# Patient Record
Sex: Female | Born: 1980 | Race: Black or African American | Hispanic: No | Marital: Married | Smoking: Current every day smoker
Health system: Southern US, Community
[De-identification: ages and names within clinical notes are randomized; demographics above are authoritative.]

## PROBLEM LIST (undated history)

## (undated) ENCOUNTER — Inpatient Hospital Stay (HOSPITAL_COMMUNITY): Payer: Self-pay

## (undated) ENCOUNTER — Inpatient Hospital Stay (HOSPITAL_COMMUNITY): Payer: BC Managed Care – PPO

## (undated) DIAGNOSIS — O09519 Supervision of elderly primigravida, unspecified trimester: Secondary | ICD-10-CM

## (undated) DIAGNOSIS — Z8759 Personal history of other complications of pregnancy, childbirth and the puerperium: Secondary | ICD-10-CM

## (undated) DIAGNOSIS — D219 Benign neoplasm of connective and other soft tissue, unspecified: Secondary | ICD-10-CM

## (undated) DIAGNOSIS — I10 Essential (primary) hypertension: Secondary | ICD-10-CM

## (undated) DIAGNOSIS — J309 Allergic rhinitis, unspecified: Secondary | ICD-10-CM

## (undated) DIAGNOSIS — Z973 Presence of spectacles and contact lenses: Secondary | ICD-10-CM

## (undated) DIAGNOSIS — N736 Female pelvic peritoneal adhesions (postinfective): Secondary | ICD-10-CM

## (undated) DIAGNOSIS — N7011 Chronic salpingitis: Secondary | ICD-10-CM

## (undated) DIAGNOSIS — J45909 Unspecified asthma, uncomplicated: Secondary | ICD-10-CM

## (undated) HISTORY — DX: Essential (primary) hypertension: I10

## (undated) HISTORY — PX: WISDOM TOOTH EXTRACTION: SHX21

## (undated) HISTORY — DX: Supervision of elderly primigravida, unspecified trimester: O09.519

## (undated) HISTORY — DX: Unspecified asthma, uncomplicated: J45.909

## (undated) HISTORY — DX: Benign neoplasm of connective and other soft tissue, unspecified: D21.9

---
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
## 2004-06-18 ENCOUNTER — Other Ambulatory Visit: Admission: RE | Admit: 2004-06-18 | Discharge: 2004-06-18 | Payer: Self-pay | Admitting: Internal Medicine

## 2005-09-08 ENCOUNTER — Ambulatory Visit: Payer: Self-pay | Admitting: Internal Medicine

## 2005-09-08 ENCOUNTER — Ambulatory Visit (HOSPITAL_COMMUNITY): Admission: RE | Admit: 2005-09-08 | Discharge: 2005-09-08 | Payer: Self-pay | Admitting: Internal Medicine

## 2005-09-15 ENCOUNTER — Ambulatory Visit: Payer: Self-pay | Admitting: Internal Medicine

## 2005-10-02 ENCOUNTER — Ambulatory Visit: Payer: Self-pay | Admitting: Internal Medicine

## 2005-10-05 ENCOUNTER — Ambulatory Visit: Payer: Self-pay | Admitting: Internal Medicine

## 2006-06-18 ENCOUNTER — Ambulatory Visit: Payer: Self-pay | Admitting: Internal Medicine

## 2006-06-25 ENCOUNTER — Ambulatory Visit: Payer: Self-pay | Admitting: Internal Medicine

## 2006-06-25 ENCOUNTER — Other Ambulatory Visit: Admission: RE | Admit: 2006-06-25 | Discharge: 2006-06-25 | Payer: Self-pay | Admitting: Internal Medicine

## 2006-06-25 ENCOUNTER — Encounter: Payer: Self-pay | Admitting: Internal Medicine

## 2006-11-19 ENCOUNTER — Ambulatory Visit: Payer: Self-pay | Admitting: Internal Medicine

## 2006-11-26 ENCOUNTER — Ambulatory Visit: Payer: Self-pay | Admitting: Internal Medicine

## 2007-03-02 ENCOUNTER — Ambulatory Visit: Payer: Self-pay | Admitting: Internal Medicine

## 2007-04-13 ENCOUNTER — Ambulatory Visit: Payer: Self-pay | Admitting: Internal Medicine

## 2007-05-23 ENCOUNTER — Ambulatory Visit: Payer: Self-pay | Admitting: Internal Medicine

## 2007-08-05 ENCOUNTER — Ambulatory Visit: Payer: Self-pay | Admitting: Internal Medicine

## 2007-08-05 DIAGNOSIS — J019 Acute sinusitis, unspecified: Secondary | ICD-10-CM | POA: Insufficient documentation

## 2007-08-05 DIAGNOSIS — I10 Essential (primary) hypertension: Secondary | ICD-10-CM | POA: Insufficient documentation

## 2007-08-05 DIAGNOSIS — R519 Headache, unspecified: Secondary | ICD-10-CM | POA: Insufficient documentation

## 2007-08-05 DIAGNOSIS — J309 Allergic rhinitis, unspecified: Secondary | ICD-10-CM | POA: Insufficient documentation

## 2007-08-05 DIAGNOSIS — R51 Headache: Secondary | ICD-10-CM | POA: Insufficient documentation

## 2007-09-06 ENCOUNTER — Telehealth: Payer: Self-pay | Admitting: Internal Medicine

## 2008-03-05 ENCOUNTER — Ambulatory Visit: Payer: Self-pay | Admitting: Internal Medicine

## 2008-03-05 DIAGNOSIS — H9319 Tinnitus, unspecified ear: Secondary | ICD-10-CM | POA: Insufficient documentation

## 2008-03-09 LAB — CONVERTED CEMR LAB
Albumin: 4.2 g/dL (ref 3.5–5.2)
Alkaline Phosphatase: 64 units/L (ref 39–117)
BUN: 8 mg/dL (ref 6–23)
Calcium: 9.4 mg/dL (ref 8.4–10.5)
Creatinine, Ser: 0.7 mg/dL (ref 0.4–1.2)
Eosinophils Absolute: 0.1 10*3/uL (ref 0.0–0.7)
Eosinophils Relative: 1.3 % (ref 0.0–5.0)
GFR calc Af Amer: 130 mL/min
GFR calc non Af Amer: 108 mL/min
Glucose, Bld: 71 mg/dL (ref 70–99)
HCT: 36.5 % (ref 36.0–46.0)
Hemoglobin: 11.8 g/dL — ABNORMAL LOW (ref 12.0–15.0)
MCV: 77.1 fL — ABNORMAL LOW (ref 78.0–100.0)
Monocytes Absolute: 0.7 10*3/uL (ref 0.1–1.0)
Monocytes Relative: 8.9 % (ref 3.0–12.0)
Neutro Abs: 5.2 10*3/uL (ref 1.4–7.7)
Platelets: 359 10*3/uL (ref 150–400)
Potassium: 4.1 meq/L (ref 3.5–5.1)
TSH: 2.58 microintl units/mL (ref 0.35–5.50)
Total Protein: 7.1 g/dL (ref 6.0–8.3)
WBC: 8.1 10*3/uL (ref 4.5–10.5)

## 2008-05-15 ENCOUNTER — Ambulatory Visit (HOSPITAL_COMMUNITY): Admission: RE | Admit: 2008-05-15 | Discharge: 2008-05-15 | Payer: Self-pay | Admitting: Obstetrics & Gynecology

## 2009-02-06 ENCOUNTER — Ambulatory Visit: Payer: Self-pay | Admitting: Internal Medicine

## 2009-02-06 DIAGNOSIS — N926 Irregular menstruation, unspecified: Secondary | ICD-10-CM | POA: Insufficient documentation

## 2009-02-06 DIAGNOSIS — K5289 Other specified noninfective gastroenteritis and colitis: Secondary | ICD-10-CM | POA: Insufficient documentation

## 2009-02-06 LAB — CONVERTED CEMR LAB
Beta hcg, urine, semiquantitative: NEGATIVE
Bilirubin Urine: NEGATIVE
Glucose, Urine, Semiquant: NEGATIVE
Ketones, urine, test strip: NEGATIVE
Specific Gravity, Urine: 1.03
Urobilinogen, UA: 0.2
pH: 5

## 2009-02-21 ENCOUNTER — Ambulatory Visit (HOSPITAL_COMMUNITY): Admission: RE | Admit: 2009-02-21 | Discharge: 2009-02-21 | Payer: Self-pay | Admitting: Obstetrics & Gynecology

## 2009-09-03 ENCOUNTER — Telehealth: Payer: Self-pay | Admitting: Internal Medicine

## 2009-09-20 ENCOUNTER — Ambulatory Visit: Payer: Self-pay | Admitting: Internal Medicine

## 2009-09-20 DIAGNOSIS — N979 Female infertility, unspecified: Secondary | ICD-10-CM | POA: Insufficient documentation

## 2009-09-24 ENCOUNTER — Telehealth: Payer: Self-pay | Admitting: *Deleted

## 2010-04-08 ENCOUNTER — Emergency Department (HOSPITAL_COMMUNITY): Admission: EM | Admit: 2010-04-08 | Discharge: 2010-04-08 | Payer: Self-pay | Admitting: Emergency Medicine

## 2010-04-08 ENCOUNTER — Telehealth: Payer: Self-pay | Admitting: Internal Medicine

## 2010-08-11 ENCOUNTER — Ambulatory Visit: Payer: Self-pay | Admitting: Family Medicine

## 2010-08-11 DIAGNOSIS — F17200 Nicotine dependence, unspecified, uncomplicated: Secondary | ICD-10-CM | POA: Insufficient documentation

## 2010-08-11 DIAGNOSIS — J45909 Unspecified asthma, uncomplicated: Secondary | ICD-10-CM | POA: Insufficient documentation

## 2010-08-11 DIAGNOSIS — J209 Acute bronchitis, unspecified: Secondary | ICD-10-CM | POA: Insufficient documentation

## 2010-11-08 ENCOUNTER — Emergency Department (HOSPITAL_COMMUNITY)
Admission: EM | Admit: 2010-11-08 | Discharge: 2010-11-09 | Payer: Self-pay | Source: Home / Self Care | Admitting: Emergency Medicine

## 2010-11-20 ENCOUNTER — Telehealth: Payer: Self-pay | Admitting: Internal Medicine

## 2010-12-14 ENCOUNTER — Encounter: Payer: Self-pay | Admitting: Obstetrics & Gynecology

## 2010-12-23 NOTE — Letter (Signed)
Summary: Out of Work  Adult nurse at [employer]  [address]   [place], [place] [address]   Phone: [phone]  Fax: [phone]    August 11, 2010   Employee:  Mckenzey P Culliton    To Whom It May Concern:   For Medical reasons, please excuse the above named employee from work for the following dates:  Start:   08/11/2010  End:   08/12/2010, may return to work this day  If you need additional information, please feel free to contact our office.         Sincerely,    Danise Edge MD

## 2010-12-23 NOTE — Assessment & Plan Note (Signed)
Summary: chest tightness/wheezing/since weekend/njr   Vital Signs:  Patient profile:   30 year old female Menstrual status:  irregular Height:      60.5 inches (153.67 cm) Weight:      227 pounds (103.18 kg) BMI:     43.76 O2 Sat:      98 % on Room air Temp:     98.5 degrees F (36.94 degrees C) oral Pulse rate:   103 / minute BP sitting:   126 / 92  (left arm) Cuff size:   large  Vitals Entered By: Josph Macho RMA (August 11, 2010 10:37 AM)  O2 Flow:  Room air CC: Chest tightness/ wheezing since this weekend X3 days/ CF Is Patient Diabetic? No   History of Present Illness: Patient in today for evaluation of 3 days URI symptoms. She notes the symptoms started abruptly 3 days ago. She is struggling with cough, wheezing. Cough is nonproductive. The symptoms started with nasal congestion productive of yellowish sputum then developed into bilateral ear pain right worse than left. She is struggling with malaise, fatigue, chills, or chest tightness, nausea and even some episodes of vomiting with extreme coughing episodes. She is a smoker and smokes roughly half pack per day although she has cut down the last few days with her worsening symptoms. Patient denies fevers, palpitations, diarrhea, anorexia, abdominal pain.  Current Medications (verified): 1)  Methyldopa 250 Mg  Tabs (Methyldopa) .Marland Kitchen.. 1 By Mouth Two Times A Day 2)  Albuterol 90 Mcg/act  Aers (Albuterol) .... Inhale 1-2 Puffs By  Mouth Every 6 Hours As Needed 3)  Fluticasone Propionate 50 Mcg/act Susp (Fluticasone Propionate) .... 2 Sprays in Each Nostril Daily  Allergies (verified): No Known Drug Allergies  Past History:  Past medical history reviewed for relevance to current acute and chronic problems. Social history (including risk factors) reviewed for relevance to current acute and chronic problems.  Past Medical History: Reviewed history from 03/05/2008 and no changes required. Hypertension Headache Allergic  rhinitis?  see old record  Social History: Reviewed history from 09/20/2009 and no changes required. 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 No apparent alopecia or balding. Eyes:  No corneal or conjunctival inflammation noted. EOMI.  Ears:  External ear exam shows no significant lesions or deformities.  Otoscopic examination reveals clear canals, tympanic membranes are intact bilaterally without bulging, retraction, inflammation or discharge. Hearing is grossly normal bilaterally. Nose:  mucosa is boggy and erythematous Mouth:  Oral mucosa and oropharynx without lesions or exudates.  Teeth in good repair. Neck:  No deformities, masses, or tenderness noted. Lungs:  Normal respiratory effort, chest expands symmetrically. Lungs are clear to auscultation, no crackles or wheezes. Heart:  Normal rate and regular rhythm. S1 and S2 normal without gallop, murmur, click, rub or other extra sounds. Decreased breath sounds b/l bases Abdomen:  Bowel sounds positive,abdomen soft and non-tender without masses, organomegaly or hernias noted. Extremities:  No clubbing, cyanosis, edema, or deformity noted   Cervical Nodes:  No lymphadenopathy noted Psych:  Cognition and judgment appear intact. Alert and cooperative with normal attention span and concentration. No apparent delusions, illusions, hallucinations   Impression & Recommendations:  Problem # 1:  ACUTE BRONCHITIS (ICD-466.0)  The following medications were removed from the medication list:  Albuterol 90 Mcg/act Aers (Albuterol) ..... Inhale 1-2 puffs by  mouth every 6 hours as needed Her updated medication list for this problem includes:    Proair Hfa 108 (90 Base) Mcg/act Aers  (Albuterol sulfate) .Marland Kitchen... 1-2 puffs by mouth q 4 hour as needed cough/sob/wheeze    Zithromax 250 Mg Tabs (Azithromycin) .Marland Kitchen... 2 tabs by mouth once then once daily x 4days Cetirizine daily and Fluticasone is sent in  Orders: Tobacco use cessation intermediate 3-10 minutes ([address]) Taken out of work for today  Problem # 2:  ALLERGIC RHINITIS (ICD-477.9)  Her updated medication list for this problem includes:    Fluticasone Propionate 50 Mcg/act Susp (Fluticasone propionate) .Marland Kitchen... 2 sprays in each nostril daily    Cetirizine Hcl 10 Mg Tabs (Cetirizine hcl) .Marland Kitchen... 1 tab by mouth daily as needed allergies/congestion  Problem # 3:  TOBACCO ABUSE (ICD-305.1)  Orders: Tobacco use cessation intermediate 3-10 minutes ([address]) Encouraged to avoid each cigarette for at least 10 minutes whenever you have a craving  Complete Medication List: 1)  Methyldopa 250 Mg Tabs (Methyldopa) .Marland Kitchen.. 1 by mouth two times a day 2)  Fluticasone Propionate 50 Mcg/act Susp (Fluticasone propionate) .... 2 sprays in each nostril daily 3)  Proair Hfa 108 (90 Base) Mcg/act Aers (Albuterol sulfate) .Marland Kitchen.. 1-2 puffs by mouth q 4 hour as needed cough/sob/wheeze 4)  Zithromax 250 Mg Tabs (Azithromycin) .... 2 tabs by mouth once then once daily x 4days 5)  Cetirizine Hcl 10 Mg Tabs (Cetirizine hcl) .Marland Kitchen.. 1 tab by mouth daily as needed allergies/congestion  Patient Instructions: 1)  Take your antibiotic as prescribed until ALL of it is gone, but stop if you develop a rash or swelling and contact our office as soon as possible.  2)  Acute Bronchitis symptoms for less then 10 days are not  helped by antibiotics. Take over the counter cough medications. Call if no improvement in 5-7 days, sooner if increasing cough, fever, or new symptoms ( shortness of breath, chest pain) .  3)  Recommend staying out of work for today 4)  Push fluid, increase rest, use Albuterol as needed. 5)  Take a probiotic such as Activia yogurt or Align capsules  daily with antibiotic use.  6)  Avoid all products with Sudafed (generic name is pseudoephedrine) due to elevated BP today Prescriptions: CETIRIZINE HCL 10 MG TABS (CETIRIZINE HCL) 1 tab by mouth daily as needed allergies/congestion  #30 x 1   Entered and Authorized by:   Danise Edge MD   Signed by:   Danise Edge MD on 08/11/2010   Method used:   Electronically to        [facility]  [address] # [id number]* (retail)       [address]       [place], [place]  [address]       Ph: [phone] or [phone]       Fax: [phone]   RxID:   [id number] FLUTICASONE PROPIONATE 50 MCG/ACT SUSP (FLUTICASONE PROPIONATE) 2 sprays in each nostril daily  #1 x 2   Entered and Authorized by:   Danise Edge MD   Signed by:   Danise Edge MD on 08/11/2010   Method used:   Electronically to        [facility]  [address] [id number]* (retail)       [address]       [facility]  [place], [place]  [address]       Ph: [phone] or [phone]       Fax: [phone]   RxID:   [id number] ZITHROMAX 250 MG TABS (AZITHROMYCIN) 2 tabs by mouth once then once daily x 4days  #6 x 0   Entered and Authorized by:   Danise Edge MD   Signed by:   Danise Edge MD on 08/11/2010   Method used:   Electronically to        [facility]  [address] [id number]* (retail)       [address]       [place], [place]  [address]       Ph: [phone] or [phone]       Fax: [phone]   RxID:   [id number] PROAIR HFA 108 (90 BASE) MCG/ACT AERS (ALBUTEROL SULFATE) 1-2 puffs by mouth q 4 hour as needed cough/sob/wheeze  #1 x 2   Entered and Authorized by:   Danise Edge MD   Signed by:   Danise Edge MD on 08/11/2010   Method used:   Electronically to        [facility]  [address] [id number]* (retail)       [address]       [place], [place]  [address]       Ph: [phone] or [phone]       Fax: [phone]   RxID:   [id number]

## 2010-12-23 NOTE — Progress Notes (Signed)
Summary: boil  Phone Note Call from Patient Call back at Home Phone [phone]   Caller: Patient Summary of Call: Pt has a boil that needs to be lanced. Pt has tried warm compress. It is red and swollen. It is located on her rt tigh. No fever. It has been going on x 3 days. Initial call taken by: Romualdo Bolk, CMA Duncan Dull),  Apr 08, 2010 5:13 PM  Follow-up for Phone Call        Per Dr. Fabian Sharp have pt go to urgent care at [facility] or [facility]. Pt aware. Follow-up by: Romualdo Bolk, CMA (AAMA),  Apr 08, 2010 5:15 PM

## 2010-12-25 NOTE — Progress Notes (Signed)
Summary: ED Visit on 11/08/10   Shirley Alvarez, Shirley Alvarez MRN: [id number] Acct#: [id number] PHYSICIAN DOCUMENTATION SHEET Thu Dec 22 09:41:19 EST 2011 Shirley Alvarez. [facility] [address] [place], [place] [address] PHONE: [phone] MRN: [id number] Account #: [id number] Name: Shirley Alvarez Sex: F Age: 30 DOB: 08-02-1981 Complaint: Vaginal bleeding Primary Diagnosis: Pregnancy Arrival Time: 11/08/2010 22:16 Discharge Time: 11/09/2010 00:33 All Providers: Ms. Drucie Opitz - PA; Dr. Cheri Guppy, MD PROVIDER: Ms. Drucie Opitz - PA HPI: The patient is a 30 year old female who presents with a chief complaint of vaginal bleeding. The history was provided by the patient. patient who is G0 presents to ER with complaints of vaginal spotting that started today stating she finished a normal menstruation 10 days ago but began spotting today and had 5-10 minutes of sharp pain in left lower abdomen though states no pain since then. states she vomited earlier today but no vomiting since. denies fevers, chills, CP, SOB, abdominal pain, n/d, dysuria, hematuria. patient states she she had pain in left upper thigh at the same time as abdominal pain but no pain since, denies skin changes. denies lower extremity pain or swelling. 22:57 11/08/2010 by Drucie Opitz - PA, Ms. ROS: Statement: all systems negative except as marked or noted in the HPI 23:00 11/08/2010 by Drucie Opitz - PA, Ms. PMH: Documentation: physician assistant reviewed/amended Historian: patient Patient's Current Physicians Patient's Current Physicians (please list PCP first) Tamela Oddi - OB/GYN, Nita Sells - IM, Neta Mends Last normal period: 10/29/2010 Past medical history: hypertension Social History: no drug abuse, current smoker w/i last 12 mos., occasional drinker Special Needs: none Allergies Drug Reaction Allergy Note NKDA 22:58 11/08/2010 by Drucie Opitz - PA, Ms. 1 Shirley Alvarez - MRN:  [id number] Acct#: [id number] Home Medications: Documentation: physician assistant reviewed/amended Medications Medication [Medication] Dosage Frequency Last Dose metoprolol tartrate Oral albuterol Inhl Flonase Nasl 22:59 11/08/2010 by Drucie Opitz - PA, Ms. Physical examination: Vital signs and O2 SAT: reviewed Constitutional: well developed, well hydrated, in no acute distress, obese Head and Face: normocephalic, atraumatic Eyes: normal appearance, no scleral icterus Neck: supple, full range of motion Spine: entire spine non-tender Cardiovascular: regular rate and rhythm, no murmur, rub, or gallop Respiratory: normal, breath sounds clear & equal bilaterally, no rales, rhonchi, wheezes, or rub Chest: nontender, no deformity, movement normal, no crepitus Abdomen: soft, nontender, nondistended, no masses, no hepatosplenomegaly, normal bowel sounds, no guarding, no rebound tenderness Genitourinary Exam: Vulva: bilateral normal Vagina and cervix: Positive for blood in vagina, bleeding moderate amount, cervical os closed and cervical os nulliparous. Negative for clots in vagina, products of conception in vagina and cervical motion tenderness. Adnexa: no palpable mass, no tenderness bilaterally Uterus: examination limited due to body habitus Extremities: normal, no deformity, full range of motion, neurovascularly intact, pulses normal, no tenderness, no edema Neuro: AA&Ox3, Cranial Nerves II-XII intact, motor intact in all extremities, sensation normal , normal reflexes, gait normal, normal speech Skin: color normal, no rash Psychiatric: AA&Ox4 23:01 11/08/2010 by Drucie Opitz - PA, Ms. Reviewed result: Result Type: Cleda Daub: [id number] Step Type: LAB Procedure Name: PREGNANCY, URINE POC Procedure: PREGNANCY, URINE POC Procedure Notes: PREGNANCY, URINE - THE SENSITIVITY OF THIS METHODOLOGY IS >24 mIU/mL Result: PREGNANCY, URINE POSITIVE 2 Shirley Alvarez -  MRN: [id number] Acct#: [id number] 23:08 11/08/2010 by Drucie Opitz - PA, Ms. Reviewed result: Result Type: Cleda Daub: [id number] Step Type: LAB Procedure Name: WET PREP Procedure: WET PREP Result: YEAST NONE SEEN [NS] TRICHOMONAS NONE  SEEN [NS] CLUE CELLS FEW [NS] A WBC WP NONE SEEN [NS] 23:10 11/08/2010 by Drucie Opitz - PA, Ms. Reviewed result: Result Type: Cleda Daub: [id number] Step Type: LAB Procedure Name: I STAT CHM 8 PANEL Procedure: I STAT CHM 8 PANEL Result: SODIUM 138 mEq/L [135-145] POTASSIUM 3.8 mEq/L [3.5-5.1] CHLORIDE 103 mEq/L [96-112] BUN 4 mg/dL [5-40] L CREATININE 0.9 mg/dL [9.8-1.1] GLUCOSE 90 mg/dL [91-47] CALCIUM, IONIZED 1.14 mmol/L [1.12-1.32] TCO2 25 mmol/L [0-100] HEMOGLOBIN 12.9 g/dL [82.9-56.2] HEMATOCRIT 38.0 % [36.0-46.0] 23:10 11/08/2010 by Drucie Opitz - PA, Ms. Reviewed result: Result Type: Cleda Daub: [id number] Step Type: LAB Procedure Name: HCG, QUANTITATIVE Procedure: HCG, QUANTITATIVE Procedure Notes: HCG, QUANTITATIVE - GEST. AGE CONC. (mIU/mL) <=1 WEEK 5 - 50 2 WEEKS 50 - 500 3 WEEKS 100 - 10,000 4 WEEKS 1,000 - 30,000 5 WEEKS 3,500 - 115,000 6-8 WEEKS 12,000 - 270,000 12 WEEKS 15,000 - 3 Shirley Alvarez - MRN: [id number] Acct#: [id number] 220,000 FEMALE AND NON-PREGNANT FEMALE: LESS THAN 5 mIU/mL Result: HCG, QUANTITATIVE 1911 mIU/mL [<5] H 23:49 11/08/2010 by Drucie Opitz - PA, Ms. Reviewed result: Result Type: Cleda Daub: [id number] Step Type: XRAY Procedure Name: US OB TRANSVAGINAL MODIFY Procedure: US OB TRANSVAGINAL MODIFY Result: Clinical Data: LMP 10/28/2010. Vaginal bleeding and cramping. Urine pregnancy test is positive. Beta HCG level is unknown. OBSTETRIC <14 WK [place] AND TRANSVAGINAL OB US Technique: Both transabdominal and transvaginal ultrasound examinations were performed for complete evaluation of the gestation as well as the maternal uterus, adnexal regions, and pelvic  cul-de-sac. Transvaginal technique was performed to assess early pregnancy. Comparison: None. Intrauterine gestational sac: None visualized Yolk sac: None Embryo: None Maternal uterus/adnexae: Uterus is normal in echogenicity and contour. The endometrium is approximately 8 mm in thickness. Both ovaries have normal appearances. Color Doppler flow is identified in both ovaries. No adnexal mass is seen. There is a small amount of simple-appearing free fluid in the cul-de-sac. IMPRESSION: No intrauterine pregnancy is identified. Differential diagnosis includes intrauterine pregnancy too early to visualize, sonographically occult ectopic pregnancy, or spontaneous abortion. Suggest correlation with Beta HCG levels and follow-up ultrasound as indicated. 23:56 11/08/2010 by Drucie Opitz - PA, Ms. [address] [place], [place] - MRN: [id number] Acct#: [id number] Reviewed result: Result Type: Cleda Daub: [id number] Step Type: XRAY Procedure Name: US OB COMP LESS 14 WK Procedure: US OB COMP LESS 14 WK Result: Clinical Data: LMP 10/28/2010. Vaginal bleeding and cramping. Urine pregnancy test is positive. Beta HCG level is unknown. OBSTETRIC <14 WK [place] AND TRANSVAGINAL OB US Technique: Both transabdominal and transvaginal ultrasound examinations were performed for complete evaluation of the gestation as well as the maternal uterus, adnexal regions, and pelvic cul-de-sac. Transvaginal technique was performed to assess early pregnancy. Comparison: None. Intrauterine gestational sac: None visualized Yolk sac: None Embryo: None Maternal uterus/adnexae: Uterus is normal in echogenicity and contour. The endometrium is approximately 8 mm in thickness. Both ovaries have normal appearances. Color Doppler flow is identified in both ovaries. No adnexal mass is seen. There is a small amount of simple-appearing free fluid in the cul-de-sac. IMPRESSION: No intrauterine pregnancy is identified.  Differential diagnosis includes intrauterine pregnancy too early to visualize, sonographically occult ectopic pregnancy, or spontaneous abortion. Suggest correlation with Beta HCG levels and follow-up ultrasound as indicated. 23:56 11/08/2010 by Drucie Opitz - PA, Ms. MDM: Comments: [place] for early IUP, occult ectopic or misscarriage with patinet who has no abdominal pain, afebrile and normal VS with established ObGyn who is agreeable to close follow up 23:58 11/08/2010 by  Drucie Opitz - PA, Ms. Patient disposition: 5 Sharayah, [place] - MRN: [id number] Acct#: [id number] Patient disposition: Disch - Home Primary Diagnosis: pregnancy Additional diagnoses: abnormal vaginal bleeding Counseling: advised of diagnosis, advised of treatment plan, advised of xray and lab findings, advised of need for close follow-up, advised of need to return for worsening or changing symptoms, advised of specific symptoms that should prompt their return, patient voices understanding 23:59 11/08/2010 by Drucie Opitz - PA, Ms. Medication disposition: Medications Medication [Medication] Dosage Frequency Last Dose Medication disposition PCP contact metoprolol tartrate Oral continue albuterol Inhl continue Flonase Nasl continue 23:59 11/08/2010 by Drucie Opitz - PA, Ms. Documentation completed by Responsible Physician 00:01 11/09/2010 by Drucie Opitz - PA, Ms. Discharge: Discharge Instructions: abnormal vaginal bleeding, folic acid for pregnancy, *free text Append a Note to Discharge Instructions: Use prenatal vitamins and follow up with Dr. Jean RosenthalChristell Constant for re-evaluation in the next 2-3 days for pre-natal care and repeat blood work and ultra-sounds. Go to [facility] for increase in vaginal bleeding or abdominal pain. May use tylenol for mild aches and pain. Referral/Appointment Refer Patient To: Phone Number: Follow-up in Appointment Details: Lajuana Ripple [phone] [facility] - OB/GYN, [place] Floor [phone] 00:01 11/09/2010 by Drucie Opitz - PA, Ms. PROVIDER: Dr. Cheri Guppy, MD Chart electronically signed by ER Physician 00:09 11/09/2010 by Cheri Guppy, MD, Dr. 6 Cat[place], [place] - MRN: [id number] Acct#: [id number] Attending: Supervision of: Midlevel: evaluation and management procedures were performed by the PA/NP/CNM under my supervision/collaboration. 00:08 11/09/2010 by Cheri Guppy, MD, Dr. Libby Maw orders: Verify orders: verify all orders 00:08 11/09/2010 by Cheri Guppy, MD, Dr. REVIEWER: Joyce Gross - Reviewer Review completed: Documentation completed 09:41 11/13/2010 by Joyce Gross - Reviewer 7

## 2011-02-02 LAB — WET PREP, GENITAL
Trich, Wet Prep: NONE SEEN
Yeast Wet Prep HPF POC: NONE SEEN

## 2011-02-02 LAB — POCT I-STAT, CHEM 8
Chloride: 103 mEq/L (ref 96–112)
Creatinine, Ser: 0.9 mg/dL (ref 0.4–1.2)
HCT: 38 % (ref 36.0–46.0)
Hemoglobin: 12.9 g/dL (ref 12.0–15.0)
Potassium: 3.8 mEq/L (ref 3.5–5.1)
Sodium: 138 mEq/L (ref 135–145)

## 2011-02-02 LAB — HCG, QUANTITATIVE, PREGNANCY: hCG, Beta Chain, Quant, S: 1911 m[IU]/mL — ABNORMAL HIGH (ref <5)

## 2011-02-06 ENCOUNTER — Ambulatory Visit (INDEPENDENT_AMBULATORY_CARE_PROVIDER_SITE_OTHER): Payer: BC Managed Care – PPO | Admitting: Internal Medicine

## 2011-02-06 ENCOUNTER — Other Ambulatory Visit: Payer: Self-pay | Admitting: Internal Medicine

## 2011-02-06 ENCOUNTER — Encounter: Payer: Self-pay | Admitting: Internal Medicine

## 2011-02-06 VITALS — BP 120/80 | HR 84 | Temp 98.8°F | Wt 241.0 lb

## 2011-02-06 DIAGNOSIS — J329 Chronic sinusitis, unspecified: Secondary | ICD-10-CM

## 2011-02-06 DIAGNOSIS — F17200 Nicotine dependence, unspecified, uncomplicated: Secondary | ICD-10-CM

## 2011-02-06 DIAGNOSIS — M79609 Pain in unspecified limb: Secondary | ICD-10-CM

## 2011-02-06 DIAGNOSIS — M79605 Pain in left leg: Secondary | ICD-10-CM

## 2011-02-06 DIAGNOSIS — R51 Headache: Secondary | ICD-10-CM

## 2011-02-06 DIAGNOSIS — J309 Allergic rhinitis, unspecified: Secondary | ICD-10-CM

## 2011-02-06 DIAGNOSIS — I10 Essential (primary) hypertension: Secondary | ICD-10-CM

## 2011-02-06 MED ORDER — AZITHROMYCIN 250 MG PO TABS: 250.0000 mg | ORAL_TABLET | ORAL | Status: AC

## 2011-02-06 NOTE — Patient Instructions (Signed)
stay on your allergy nose spray Add the antibiotic. Call in a 5-7 days if not getting better or if worse. There may be a component of allergic sinusitis and sometimes I add prednisone to the antibiotic  Someone will contact you about a Headache referral Leg pain may be sciatica and  Avoid prolonged sitting .   Lumbar Radiculopathy, Sciatica Sciatica is a weakness and/or changes in sensation (tingling, jolts, hot and cold, numbness) along the path the sciatic nerve travels. Irritation or damage to lumbar nerve roots is often also referred to as lumbar radiculopathy.  Lumbar radiculopathy (Sciatica) is the most common form of this problem. Radiculopathy can occur in any of the nerves coming out of the spinal cord. The problems caused depend on which nerves are involved. The sciatic nerve is the large nerve supplying the branches of nerves going from the hip to the toes. It often causes a numbness or weakness in the skin and/or muscles that the sciatic nerve serves. It also may cause symptoms (problems) of pain, burning, tingling, or electric shock-like feelings in the path of this nerve. This usually comes from injury to the fibers that make up the sciatic nerve. Some of these symptoms are low back pain and/or unpleasant feelings in the following areas:  From the mid-buttock down the back of the leg to the back of the knee.   And/or the outside of the calf and top of the foot.   And/or behind the inner ankle to the sole of the foot.  CAUSES  Herniated or slipped disc. Discs are the little cushions between the bones in the back.   Pressure by the piriformis muscle in the buttock on the sciatic nerve (Piriformis Syndrome).   Misalignment of the bones in the lower back and buttocks (Sacroiliac Joint Derangement).   Narrowing of the spinal canal that puts pressure on or pinches the fibers that make up the sciatic nerve.   A slipped vertebra that is out of line with those above or beneath it.    Abnormality of the nervous system itself so that nerve fibers do not transmit signals properly, especially to feet and calves (neuropathy).   Tumor (this is rare).  Your caregiver can usually determine the cause of your sciatica and begin the treatment most likely to help you. TREATMENT Taking over-the-counter painkillers, physical therapy, rest, exercise, spinal manipulation, and injections of anesthetics and/or steroids may be used. Surgery, acupuncture, and Yoga can also be effective. Mind over matter techniques, mental imagery, and changing factors such as your bed, chair, desk height, posture, and activities are other treatments that may be helpful. You and your caregiver can help determine what is best for you. With proper diagnosis, the cause of most sciatica can be identified and removed. Communication and cooperation between your caregiver and you is essential. If you are not successful immediately, do not be discouraged. With time, a proper treatment can be found that will make you comfortable. HOME CARE INSTRUCTIONS  If the pain is coming from a problem in the back, applying ice to that area 4  times per day while awake, may be helpful. Put the ice in a plastic bag. Place a towel between the bag of ice and your skin.   You may exercise or perform your usual activities if these do not aggravate your pain, or as suggested by your caregiver.   Only take over-the-counter or prescription medicines for pain, discomfort, or fever as directed by your caregiver.   If your  caregiver has given you a follow-up appointment, it is very important to keep that appointment. Not keeping the appointment could result in a chronic or permanent injury, pain, and disability. If there is any problem keeping the appointment, you must call back to this facility for assistance.  SEEK IMMEDIATE MEDICAL CARE IF:  You experience loss of control of bowel or bladder.   You have increasing weakness in the trunk,  buttocks, or legs.   There is numbness in any areas from the hip down to the toes.   You have difficulty walking or keeping your balance.   You have any of the above, with fever or forceful vomiting.  Document Released: 11/03/2001 Document Re-Released: 12/01/2009 [facility] Patient Information 2011 [place], [name].  Marland Kitchen

## 2011-02-06 NOTE — Telephone Encounter (Signed)
Opened in error

## 2011-02-07 ENCOUNTER — Encounter: Payer: Self-pay | Admitting: Internal Medicine

## 2011-02-07 NOTE — Assessment & Plan Note (Signed)
Currently is only taking 2 to 3 cigarettes every few days  But other smoke in the household intricate environmental tobacco smoke  Counseling today.

## 2011-02-07 NOTE — Progress Notes (Signed)
  Subjective:    Patient ID: Shirley Alvarez, female    DOB: [birth year], 30 y.o.   MRN: [id number]  HPI Patient comes in today for acute care visit. Just a couple of issues. 1: over the last 3 to 4 weeks she has had upper respiratory congestion like a head cold that has she feels turned into a sinus infection. She denies any fever currently but has facial aching pressure and upper teeth discomfort she's congested and has some occasional discharge but not as bad as in the past. She has a sore throat and Right ear discomfort with this.  She has a history of allergies with some recent flare but it's been using her Flonase regularly.  2. Her headaches are ongoing which are different than her sinus problems she gets headaches now to do three times a week we had changed her medications and monitored and she still having problems she agrees to go to a headache specialist.  3. Left leg has had some problems off and on recently first the right leg in the left leg she describes it as a pain traveling behind her left side down to her foot. No current weakness but originally her right leg felt like it was going to get out. She denies any back pain lifting or injury or swelling.   Review of Systems Negative chest pain shortness of breath current wheezing bleeding rest as per HPI    Objective:   Physical Exam That is a well-developed well-nourished African-American female  In nad who looks allergic and congested. HEENT: Normocephalic ;atraumatic , Eyes;  PERRL, EOMs  Full, lids and conjunctiva clear,,Ears: no deformities, canals nl, TM landmarks normal, Nose: no deformity or discharge  Turbinates and creased left para nasal area is tender Mouth : OP clear without lesion or edema . Neck: Supple without adenopathy or masses Chest:  Clear to A&P without wheezes rales or rhonchi CV:  S1-S2 no gallops or murmurs peripheral perfusion is normal Neruo : grossly intact  LEft leg nl no tenderness  Neg slr   gait  is nl  no bruising     Assessment & Plan:  Possible sinusitis unclear if allergic or infectious discussed options she states she did not have a good experience with amoxicillin before and the azithromycin helped her after two days when she took it in the fall. Other options possible will go ahead and give her the same innocent day and continue on her Flonase. I'm not convinced that this is not allergic and we could consider adding more allergy medication.  Headaches ;   Recurrent ongoing agree with referral nonfocal exam today  Leg pain currently is improving possible sciatica discussion in counseling Tobacco : disc strategies to stop altogether and avoid ETS.

## 2011-02-09 LAB — CULTURE, ROUTINE-ABSCESS

## 2011-02-10 ENCOUNTER — Telehealth: Payer: Self-pay | Admitting: *Deleted

## 2011-02-10 MED ORDER — PREDNISONE 20 MG PO TABS: ORAL_TABLET | ORAL | Status: DC

## 2011-02-10 NOTE — Telephone Encounter (Signed)
Pt started an antibiotic 4 days ago, but is wheezing now and SOB.  No fever.  Would like Prednisone. Does not feel like she needs office appt and Dr. Fabian Sharp knows she has asthma.

## 2011-02-10 NOTE — Telephone Encounter (Signed)
Addended byMadelin Headings on: 02/10/2011 05:28 PM   Modules accepted: Orders

## 2011-02-10 NOTE — Telephone Encounter (Signed)
agree

## 2011-02-18 ENCOUNTER — Telehealth: Payer: Self-pay | Admitting: *Deleted

## 2011-02-18 NOTE — Telephone Encounter (Signed)
Spoke with [facility] on call - then relayed info to dr. Fabian Sharp via phone - pt to be seen in next 48 h for reck and take meds today as rx'd (only one dose thus far) - to go to er if symptoms or problems before OV

## 2011-02-18 NOTE — Telephone Encounter (Signed)
Left message on machine to call back  

## 2011-02-18 NOTE — Telephone Encounter (Signed)
Have her get a blood pressure machine for her use . Get measurements      If still elevated  Can work her in this week Otherwise  ov next week and bring her machine  Review of  Out records show normal BP readings even 2 weeks ago.

## 2011-02-18 NOTE — Telephone Encounter (Signed)
Charmaine calling from [facility] PT's bp today 174/127 on left 166/123 on rt. Dr. Antionette Char. No complaints but has been noticing that she has been having headaches.

## 2011-02-19 ENCOUNTER — Encounter: Payer: Self-pay | Admitting: Internal Medicine

## 2011-02-19 NOTE — Telephone Encounter (Signed)
Pt aware and will take her bp readings. She is coming in tomorrow at 3:30pm

## 2011-02-20 ENCOUNTER — Ambulatory Visit (INDEPENDENT_AMBULATORY_CARE_PROVIDER_SITE_OTHER): Payer: BC Managed Care – PPO | Admitting: Internal Medicine

## 2011-02-20 ENCOUNTER — Encounter: Payer: Self-pay | Admitting: Internal Medicine

## 2011-02-20 VITALS — BP 120/80 | HR 60 | Wt 246.0 lb

## 2011-02-20 DIAGNOSIS — R51 Headache: Secondary | ICD-10-CM

## 2011-02-20 DIAGNOSIS — I10 Essential (primary) hypertension: Secondary | ICD-10-CM

## 2011-02-20 NOTE — Patient Instructions (Addendum)
Take medication twice a day  And monitor BP readings as we discussed  DASH diet   May help a good bit. Avoid processed foods andsodium. Call if BP readings are over 155/105 on a regular basis  In the meantime. Keep you appt with Headache people.

## 2011-02-20 NOTE — Progress Notes (Signed)
  Subjective:    Patient ID: Shirley Alvarez, female    DOB: 1981-01-04, 30 y.o.   MRN: [id number]  HPI Patient comes in as followup as an acute appointment this week after having very elevated blood pressure readings at her OB/GYN appointment. See phone note. This was a followup after the miscarriage and Edia does not think she was particularly anxious. She was feeling fine that day. She does admit to the fact that she had Had missed doses of med and was only taking q d instead of bid. Since the summer when she was eating better Bp yesterday on her wrist cuff 160 today better 130 range  Headaches are some better and has appt with [facility] clinic April 23rd.  Review of Systems   negative chest pain shortness of breath sweating vision changes bleeding. Smoking only occasionally. Past Medical History  Diagnosis Date  . Allergy   . Hypertension   . Generalized headaches    No past surgical history on file.  reports that she has been smoking.  She does not have any smokeless tobacco history on file. She reports that she drinks alcohol. She reports that she does not use illicit drugs. family history includes Asthma in her sisters and Liver disease in her father. No Known Allergies     Objective:   Physical Exam Well-developed well-nourished in no acute distress she does not look ill. Blood pressure right arm 138/98 wrist 154 /106    respirations unlabored cardiovascular S1-S2 no gallops or murmurs negative pedal edema Neurologic non focal  psychologic intact oriented coherent.    Assessment & Plan:  Hypertension Not in good control however we have gotten some normal readings s but certainly not as high as in the OB/GYN's office. We discussed how to use the wrist cuff and monitor and that she should be taking her Aldomet twice a day. We discussed increasing to 3 times a day or increasing dosage but because it causes some drowsiness she would like to continue on the twice a day dosing and  to implement lifestyle interventions. She will come back in a month and we'll review the situation  Headaches These have actually improved but she does have an appointment to see the neurologist headache person in April 23  Total visit > 50% spent counseling and coordinating care     We'll send copy of note to the GYN.

## 2011-03-23 ENCOUNTER — Ambulatory Visit: Payer: BC Managed Care – PPO | Admitting: Internal Medicine

## 2011-06-04 ENCOUNTER — Other Ambulatory Visit: Payer: Self-pay | Admitting: Family Medicine

## 2011-12-10 ENCOUNTER — Encounter (HOSPITAL_COMMUNITY): Payer: Self-pay | Admitting: *Deleted

## 2011-12-10 ENCOUNTER — Inpatient Hospital Stay (HOSPITAL_COMMUNITY)
Admission: AD | Admit: 2011-12-10 | Discharge: 2011-12-10 | Disposition: A | Discharge status: Home / Self Care | Payer: BC Managed Care – PPO | Source: Ambulatory Visit | Attending: Obstetrics & Gynecology | Admitting: Obstetrics & Gynecology

## 2011-12-10 ENCOUNTER — Ambulatory Visit (HOSPITAL_COMMUNITY)
Admission: RE | Admit: 2011-12-10 | Discharge: 2011-12-10 | Disposition: A | Discharge status: Home / Self Care | Payer: BC Managed Care – PPO | Source: Ambulatory Visit | Attending: Obstetrics & Gynecology | Admitting: Obstetrics & Gynecology

## 2011-12-10 ENCOUNTER — Other Ambulatory Visit: Payer: Self-pay | Admitting: Obstetrics & Gynecology

## 2011-12-10 DIAGNOSIS — O280 Abnormal hematological finding on antenatal screening of mother: Secondary | ICD-10-CM

## 2011-12-10 DIAGNOSIS — Z3689 Encounter for other specified antenatal screening: Secondary | ICD-10-CM | POA: Insufficient documentation

## 2011-12-10 DIAGNOSIS — O009 Unspecified ectopic pregnancy without intrauterine pregnancy: Secondary | ICD-10-CM

## 2011-12-10 DIAGNOSIS — O0281 Inappropriate change in quantitative human chorionic gonadotropin (hCG) in early pregnancy: Secondary | ICD-10-CM

## 2011-12-10 DIAGNOSIS — O00109 Unspecified tubal pregnancy without intrauterine pregnancy: Secondary | ICD-10-CM | POA: Insufficient documentation

## 2011-12-10 LAB — COMPREHENSIVE METABOLIC PANEL
Albumin: 3.8 g/dL (ref 3.5–5.2)
BUN: 7 mg/dL (ref 6–23)
Calcium: 9.9 mg/dL (ref 8.4–10.5)
GFR calc Af Amer: >90 mL/min (ref >90)
Glucose, Bld: 74 mg/dL (ref 70–99)
Total Protein: 6.6 g/dL (ref 6.0–8.3)

## 2011-12-10 LAB — CBC
HCT: 34.5 % — ABNORMAL LOW (ref 36.0–46.0)
MCHC: 33 g/dL (ref 30.0–36.0)
MCV: 80.8 fL (ref 78.0–100.0)
Platelets: 308 10*3/uL (ref 150–400)
RDW: 15.9 % — ABNORMAL HIGH (ref 11.5–15.5)

## 2011-12-10 MED ORDER — METHOTREXATE INJECTION FOR WOMEN'S HOSPITAL
50.0000 mg/m2 | Freq: Once | INTRAMUSCULAR | Status: AC
  Administered 2011-12-10: 110 mg via INTRAMUSCULAR
  Filled 2011-12-10: qty 2.2

## 2011-12-10 NOTE — ED Notes (Signed)
PA in with pt and spouse, discussing ectopic, methotrexate and plan.  'pink' handout given and reviewed with pt.

## 2011-12-10 NOTE — ED Notes (Signed)
Side effects and follow up reviewed with pt. Pt to lobby.

## 2011-12-10 NOTE — ED Notes (Signed)
Tolerated injection.

## 2011-12-10 NOTE — Progress Notes (Signed)
Sent from office, after [place] confirmation.  Rt ectopic.  Pt understood diagnosis and plan for injection.

## 2011-12-13 ENCOUNTER — Inpatient Hospital Stay (HOSPITAL_COMMUNITY)
Admission: AD | Admit: 2011-12-13 | Discharge: 2011-12-13 | Disposition: A | Discharge status: Home / Self Care | Payer: BC Managed Care – PPO | Source: Ambulatory Visit | Attending: Obstetrics & Gynecology | Admitting: Obstetrics & Gynecology

## 2011-12-13 DIAGNOSIS — O00109 Unspecified tubal pregnancy without intrauterine pregnancy: Secondary | ICD-10-CM | POA: Insufficient documentation

## 2011-12-13 LAB — HCG, QUANTITATIVE, PREGNANCY: hCG, Beta Chain, Quant, S: 1026 m[IU]/mL — ABNORMAL HIGH (ref <5)

## 2011-12-13 NOTE — Progress Notes (Signed)
No bleeding have gas type pains day #4 s/p Methotrexate.

## 2011-12-13 NOTE — ED Provider Notes (Signed)
History     No chief complaint on file.  HPI Shirley Alvarez is 31 y.o. G2P0010 presents for BHCG on day 4 MTX.  She is a patient of Dr. Lodema Pilot.  She had BHCG 1/17--924 and U/S that saw no IUP, right ring inferior to normal right ovaray that was highly suspicious for right ectopic.  She reports gas like pains today.  Denies vaginal bleeding.    Past Medical History  Diagnosis Date  . Allergy   . Hypertension   . Generalized headaches   . Asthma   . Ectopic pregnancy Jan 2013  . Urinary tract infection   . Ovarian cyst     Past Surgical History  Procedure Date  . No past surgeries     Family History  Problem Relation Age of Onset  . Liver disease Father   . Asthma Sister   . Asthma Sister   . Anesthesia problems Other     History  Substance Use Topics  . Smoking status: Current Some Day Smoker -- 3 years  . Smokeless tobacco: Never Used  . Alcohol Use: Yes     not curretnly    Allergies: No Known Allergies  Prescriptions prior to admission  Medication Sig Dispense Refill  . acetaminophen (TYLENOL) 500 MG tablet Take 500-1,000 mg by mouth as needed. For Headache or pain.      Marland Kitchen albuterol (PROAIR HFA) 108 (90 BASE) MCG/ACT inhaler Inhale 2 puffs into the lungs every 6 (six) hours as needed.        . cetirizine (ZYRTEC) 10 MG tablet Take 10 mg by mouth daily.        . fluticasone (FLONASE) 50 MCG/ACT nasal spray instill 2 sprays into each nostril once daily  16 g  2  . methyldopa (ALDOMET) 500 MG tablet Take 500 mg by mouth 2 (two) times daily.      . Prenatal Vit-Fe Fumarate-FA (PRENATAL MULTIVITAMIN) TABS Take 1 tablet by mouth daily.      . progesterone (PROMETRIUM) 200 MG capsule Take 200 mg by mouth daily.        Review of Systems  Gastrointestinal: Positive for abdominal pain (described as gas like pains).  Genitourinary:       Negative for vaginal bleeding.   Physical Exam   Blood pressure 150/85, pulse 105, temperature 99.6 F (37.6 C),  temperature source Oral, resp. rate 16, last menstrual period 10/14/2011.  Physical Exam  Constitutional: She is oriented to person, place, and time. She appears well-developed and well-nourished. No distress.  Genitourinary:       Not indicated  Neurological: She is alert and oriented to person, place, and time.  Psychiatric: She has a normal mood and affect. Her behavior is normal.      Results for orders placed during the hospital encounter of 12/13/11 (from the past 24 hour(s))  HCG, QUANTITATIVE, PREGNANCY     Status: Abnormal   Collection Time   12/13/11  2:09 PM      Component Value Range   hCG, Beta Chain, Quant, S 1026 (*) <5 (mIU/mL)   MAU Course  Procedures  MDM 14:50  Reported [facility] result to Dr.Jackson-Moore.  Instructions given to have patient return to the office on Day7 for BHCG. Assessment and Plan  A:  Right ectopic pregnancy treated with Methotrexate--day4  P:  Return to office on Day 7 for repeat lab work.  Call for increased pain or heavy vaginal bleeding.  [name],EVE M 12/13/2011, 2:48 PM  Matt Holmes, NP 12/13/11 1455

## 2012-02-09 ENCOUNTER — Ambulatory Visit (INDEPENDENT_AMBULATORY_CARE_PROVIDER_SITE_OTHER): Payer: BC Managed Care – PPO | Admitting: Internal Medicine

## 2012-02-09 ENCOUNTER — Encounter: Payer: Self-pay | Admitting: Internal Medicine

## 2012-02-09 VITALS — BP 120/80 | HR 111 | Temp 98.7°F | Wt 229.0 lb

## 2012-02-09 DIAGNOSIS — Z720 Tobacco use: Secondary | ICD-10-CM

## 2012-02-09 DIAGNOSIS — Z8742 Personal history of other diseases of the female genital tract: Secondary | ICD-10-CM

## 2012-02-09 DIAGNOSIS — F17200 Nicotine dependence, unspecified, uncomplicated: Secondary | ICD-10-CM

## 2012-02-09 DIAGNOSIS — I10 Essential (primary) hypertension: Secondary | ICD-10-CM

## 2012-02-09 DIAGNOSIS — J069 Acute upper respiratory infection, unspecified: Secondary | ICD-10-CM

## 2012-02-09 DIAGNOSIS — H6690 Otitis media, unspecified, unspecified ear: Secondary | ICD-10-CM

## 2012-02-09 DIAGNOSIS — Z8759 Personal history of other complications of pregnancy, childbirth and the puerperium: Secondary | ICD-10-CM | POA: Insufficient documentation

## 2012-02-09 MED ORDER — AMOXICILLIN 875 MG PO TABS: 875.0000 mg | ORAL_TABLET | Freq: Two times a day (BID) | ORAL | Status: AC

## 2012-02-09 MED ORDER — HYDROCODONE-HOMATROPINE 5-1.5 MG/5ML PO SYRP: 5.0000 mL | ORAL_SOLUTION | ORAL | Status: AC | PRN

## 2012-02-09 NOTE — Patient Instructions (Signed)
Stop tobacco  You have an ear infection Pain should subside . In 48 hours . Cough med for comfort .    Hearing should get better in  2-3 weeks  Contact us if not better or as needed.  Otitis Media, Adult A middle ear infection is an infection in the space behind the eardrum. The medical name for this is "otitis media." It may happen after a common cold. It is caused by a germ that starts growing in that space. You may feel swollen glands in your neck on the side of the ear infection. HOME CARE INSTRUCTIONS   Take your medicine as directed until it is gone, even if you feel better after the first few days.   Only take over-the-counter or prescription medicines for pain, discomfort, or fever as directed by your caregiver.   Occasional use of a nasal decongestant a couple times per day may help with discomfort and help the eustachian tube to drain better.  Follow up with your caregiver in 10 to 14 days or as directed, to be certain that the infection has cleared. Not keeping the appointment could result in a chronic or permanent injury, pain, hearing loss and disability. If there is any problem keeping the appointment, you must call back to this facility for assistance. SEEK IMMEDIATE MEDICAL CARE IF:   You are not getting better in 2 to 3 days.   You have pain that is not controlled with medication.   You feel worse instead of better.   You cannot use the medication as directed.   You develop swelling, redness or pain around the ear or stiffness in your neck.  MAKE SURE YOU:   Understand these instructions.   Will watch your condition.   Will get help right away if you are not doing well or get worse.  Document Released: 08/14/2004 Document Revised: 10/29/2011 Document Reviewed: 06/15/2008 [facility] Patient Information 2012 [name], [name].

## 2012-02-09 NOTE — Progress Notes (Signed)
  Subjective:    Patient ID: Shirley Alvarez, female    DOB: 05/20/1981, 31 y.o.   MRN: [id number]  HPI Patient comes in today for SDA for  new problem evaluation. Decreased hearing both ears left more than right and pain last pm. Happened over few days . Has had congestion for a week probably before onset of worsening . Now having Coughing and vomiting and sinus pressure   On  Ongoing 5 days.   3-5 x per day.NO fever but night sweats Tried nyquil and dayquil.   Vomits with cough.     Some wheeze but no sob . Wheezing  But tried her  Albuterol and this may have    Made  It worse.   Updated interim hx : Had ectopic preg in Jan and had MTX shot and hcg came down to less than 5  On aldomet for bp running out  Asks about getting refill .  Review of Systems Neg cp sob no hemoptysis as per hpi no bleeding  Plans conception again.   Past history family history social history reviewed in the electronic medical record.     Objective:   Physical Exam WDWN in NAD  quiet respirations; very congested  somewhat hoarse. Non toxic . HEENT: Normocephalic ;atraumatic , Eyes;  PERRL, EOMs  Full, lids and conjunctiva clear,,Ears: no deformities, canals nl, TM landmarks nl right and fluid  Ieft tm  Distorted and red hemorrhagic at base but intact    Nose: no deformity or discharge but  Very congested;face minimally tender Mouth : OP clear without lesion or edema .  Cobblestoning noted  Neck: Supple without adenopathy or masses or bruits Chest:  Clear to A&P without wheezes rales or rhonchi  bs = ? Dec air movement  CV:  S1-S2 no gallops or murmurs peripheral perfusion is normal Skin :nl perfusion and no acute rashes       Assessment & Plan:  Left  Acute om and right serous otitis.   Risk benefit of medication discussed. Antibiotic x 7 days  Acute rti   Sx rx cough med at night for comfort  Tobacco needs to stop HT   Call gyne office for refill as they have managed her bp meds.

## 2012-02-11 DIAGNOSIS — J069 Acute upper respiratory infection, unspecified: Secondary | ICD-10-CM | POA: Insufficient documentation

## 2012-02-11 DIAGNOSIS — Z720 Tobacco use: Secondary | ICD-10-CM | POA: Insufficient documentation

## 2012-02-11 DIAGNOSIS — I10 Essential (primary) hypertension: Secondary | ICD-10-CM | POA: Insufficient documentation

## 2012-06-30 ENCOUNTER — Other Ambulatory Visit (HOSPITAL_COMMUNITY): Payer: Self-pay | Admitting: Obstetrics and Gynecology

## 2012-07-21 ENCOUNTER — Other Ambulatory Visit (HOSPITAL_COMMUNITY): Payer: Self-pay | Admitting: Obstetrics and Gynecology

## 2012-07-21 DIAGNOSIS — N96 Recurrent pregnancy loss: Secondary | ICD-10-CM

## 2012-07-28 ENCOUNTER — Ambulatory Visit (HOSPITAL_COMMUNITY)
Admission: RE | Admit: 2012-07-28 | Discharge: 2012-07-28 | Disposition: A | Discharge status: Home / Self Care | Payer: BC Managed Care – PPO | Source: Ambulatory Visit | Attending: Obstetrics and Gynecology | Admitting: Obstetrics and Gynecology

## 2012-07-28 DIAGNOSIS — N96 Recurrent pregnancy loss: Secondary | ICD-10-CM | POA: Insufficient documentation

## 2012-07-28 MED ORDER — IOHEXOL 300 MG/ML  SOLN
10.0000 mL | Freq: Once | INTRAMUSCULAR | Status: AC | PRN
  Administered 2012-07-28: 10 mL

## 2012-07-29 ENCOUNTER — Other Ambulatory Visit: Payer: Self-pay | Admitting: Family Medicine

## 2012-07-29 ENCOUNTER — Other Ambulatory Visit: Payer: Self-pay | Admitting: Internal Medicine

## 2012-08-01 ENCOUNTER — Ambulatory Visit (INDEPENDENT_AMBULATORY_CARE_PROVIDER_SITE_OTHER): Payer: BC Managed Care – PPO | Admitting: Internal Medicine

## 2012-08-01 ENCOUNTER — Encounter: Payer: Self-pay | Admitting: Internal Medicine

## 2012-08-01 VITALS — BP 144/100 | HR 103 | Temp 98.6°F | Wt 236.0 lb

## 2012-08-01 DIAGNOSIS — I10 Essential (primary) hypertension: Secondary | ICD-10-CM

## 2012-08-01 DIAGNOSIS — J309 Allergic rhinitis, unspecified: Secondary | ICD-10-CM

## 2012-08-01 DIAGNOSIS — Z8742 Personal history of other diseases of the female genital tract: Secondary | ICD-10-CM

## 2012-08-01 DIAGNOSIS — N979 Female infertility, unspecified: Secondary | ICD-10-CM

## 2012-08-01 DIAGNOSIS — Z720 Tobacco use: Secondary | ICD-10-CM

## 2012-08-01 DIAGNOSIS — E669 Obesity, unspecified: Secondary | ICD-10-CM

## 2012-08-01 DIAGNOSIS — F17200 Nicotine dependence, unspecified, uncomplicated: Secondary | ICD-10-CM

## 2012-08-01 DIAGNOSIS — Z8759 Personal history of other complications of pregnancy, childbirth and the puerperium: Secondary | ICD-10-CM

## 2012-08-01 MED ORDER — FLUTICASONE PROPIONATE 50 MCG/ACT NA SUSP: 2.0000 | Freq: Every day | NASAL | Status: DC

## 2012-08-01 MED ORDER — METHYLDOPA 500 MG PO TABS: 500.0000 mg | ORAL_TABLET | Freq: Two times a day (BID) | ORAL | Status: DC

## 2012-08-01 NOTE — Progress Notes (Signed)
Subjective:    Patient ID: Shirley Alvarez, female    DOB: 1981-03-04, 31 y.o.   MRN: [id number]  HPI Pt comes in today  To have transfer of med  Management for her HT . From her ob gyne Dr Renaldo Fiddler . She is on aldomet bid 500   BP 140/90  Range readings.   At home  And had been better at home and then had ectopic preg.  And had increase dosing then per her OB    Once a day in am .   Takes evening   Dose 5-7 pm.  A little high  At present from ? Weight gain. Not paying attention to diet . Allergic  refill  On meds for seasonal allergy nasal steroid in the past.  Asthma   ocass  Once every 3 weeks. Use of rescue inhaler  Still some tobacco  Wants to stop   Review of Systems NO cp sob syncope  Hx of ectopic still planning try for conception no bleeding  Past history family history social history reviewed in the electronic medical record. Outpatient Encounter Prescriptions as of 08/01/2012  Medication Sig Dispense Refill  . acetaminophen (TYLENOL) 500 MG tablet Take 500-1,000 mg by mouth as needed. For Headache or pain.      Marland Kitchen albuterol (PROAIR HFA) 108 (90 BASE) MCG/ACT inhaler Inhale 2 puffs into the lungs every 6 (six) hours as needed.        . fluticasone (FLONASE) 50 MCG/ACT nasal spray Place 2 sprays into the nose daily. Each nostril  16 g  6  . loratadine (CLARITIN) 10 MG tablet Take 10 mg by mouth daily.      . methyldopa (ALDOMET) 500 MG tablet Take 1 tablet (500 mg total) by mouth 2 (two) times daily.  60 tablet  5  . Prenatal Vit-Fe Fumarate-FA (PRENATAL MULTIVITAMIN) TABS Take 1 tablet by mouth daily.      Marland Kitchen DISCONTD: cetirizine (ZYRTEC) 10 MG tablet Take 10 mg by mouth daily.        Marland Kitchen DISCONTD: fluticasone (FLONASE) 50 MCG/ACT nasal spray instill 2 sprays into each nostril once daily  16 g  2  . DISCONTD: methyldopa (ALDOMET) 500 MG tablet take 1 tablet by mouth twice a day  60 tablet  0       Objective:   Physical Exam BP 144/100  Pulse 103  Temp 98.6 F (37 C) (Oral)   Wt 236 lb (107.049 kg)  SpO2 96%  LMP 07/20/2012 Wt Readings from Last 3 Encounters:  08/01/12 236 lb (107.049 kg)  02/09/12 229 lb (103.874 kg)  12/10/11 233 lb (105.688 kg)  WDWN in nad looks allergic  HEENT no acute changes som nasal stuffiness Neck: Supple without adenopathy or masses or bruits repeat bp readings 144/98 Chest:  Clear to A&P without wheezes rales or rhonchi CV:  S1-S2 no gallops or murmurs peripheral perfusion is normal Abdomen:  Sof,t normal bowel sounds without hepatosplenomegaly, no guarding rebound or masses no CVA tenderness No clubbing cyanosis or edema   Lab Results  Component Value Date   WBC 9.1 12/10/2011   HGB 11.4* 12/10/2011   HCT 34.5* 12/10/2011   PLT 308 12/10/2011   GLUCOSE 74 12/10/2011   ALT 18 12/10/2011   AST 17 12/10/2011   NA 138 12/10/2011   K 4.1 12/10/2011   CL 104 12/10/2011   CREATININE 0.75 12/10/2011   BUN 7 12/10/2011   CO2 27 12/10/2011   TSH 2.58 03/05/2008  Assessment & Plan:  HT   tenuous control  Recently  Disc lsi dash diet and increasing med pt prefers lsi for this first and not to add extra meds   rov in 3-4 months or as needed. If elevated and we can increase meds.  Fertility    Had rx  Pre conception discussion  Tobacco stop this Allergic rhinitis  Disc options nasal ICNS best rx for now  Obesity  Weight loss may help health risk . Has had recently weight gain adding to above   Declined flu shot. Today .  Total visit > 50% spent counseling and coordinating care

## 2012-08-01 NOTE — Patient Instructions (Signed)
Monitor BP   Readings  As discussed   About 3 x per week.  Goal is BP below 140/90  Dash diet  Is helpful. As well as weight loss . Exercise helps.  Stopping tobacco is best for your health.  If bp readings are not at goal  After 3-4 weeks   ;then we can increase  By 250 mg of the aldomet  Per day and see how you do .  ROV in  3-  4  months or as needed.    DASH Diet The DASH diet stands for "Dietary Approaches to Stop Hypertension." It is a healthy eating plan that has been shown to reduce high blood pressure (hypertension) in as little as 14 days, while also possibly providing other significant health benefits. These other health benefits include reducing the risk of breast cancer after menopause and reducing the risk of type 2 diabetes, heart disease, colon cancer, and stroke. Health benefits also include weight loss and slowing kidney failure in patients with chronic kidney disease.  DIET GUIDELINES  Limit salt (sodium). Your diet should contain less than 1500 mg of sodium daily.   Limit refined or processed carbohydrates. Your diet should include mostly whole grains. Desserts and added sugars should be used sparingly.   Include small amounts of heart-healthy fats. These types of fats include nuts, oils, and tub margarine. Limit saturated and trans fats. These fats have been shown to be harmful in the body.  CHOOSING FOODS  The following food groups are based on a 2000 calorie diet. See your Registered Dietitian for individual calorie needs. Grains and Grain Products (6 to 8 servings daily)  Eat More Often: Whole-wheat bread, brown rice, whole-grain or wheat pasta, quinoa, popcorn without added fat or salt (air popped).   Eat Less Often: White bread, white pasta, white rice, cornbread.  Vegetables (4 to 5 servings daily)  Eat More Often: Fresh, frozen, and canned vegetables. Vegetables may be raw, steamed, roasted, or grilled with a minimal amount of fat.   Eat Less Often/Avoid:  Creamed or fried vegetables. Vegetables in a cheese sauce.  Fruit (4 to 5 servings daily)  Eat More Often: All fresh, canned (in natural juice), or frozen fruits. Dried fruits without added sugar. One hundred percent fruit juice ( cup [237 mL] daily).   Eat Less Often: Dried fruits with added sugar. Canned fruit in light or heavy syrup.  [employer], Fish, and Poultry (2 servings or less daily. One serving is 3 to 4 oz [85-114 g]).  Eat More Often: Ninety percent or leaner ground beef, tenderloin, sirloin. Round cuts of beef, chicken breast, [place] breast. All fish. Grill, bake, or broil your meat. Nothing should be fried.   Eat Less Often/Avoid: Fatty cuts of meat, [place], or chicken leg, thigh, or wing. Fried cuts of meat or fish.  Dairy (2 to 3 servings)  Eat More Often: Low-fat or fat-free milk, low-fat plain or light yogurt, reduced-fat or part-skim cheese.   Eat Less Often/Avoid: Milk (whole, 2%, skim, or chocolate).Whole milk yogurt. Full-fat cheeses.  Nuts, Seeds, and Legumes (4 to 5 servings per week)  Eat More Often: All without added salt.   Eat Less Often/Avoid: Salted nuts and seeds, canned beans with added salt.  Fats and Sweets (limited)  Eat More Often: Vegetable oils, tub margarines without trans fats, sugar-free gelatin. Mayonnaise and salad dressings.   Eat Less Often/Avoid: Coconut oils, palm oils, butter, stick margarine, cream, half and half, cookies, candy, pie.  FOR MORE INFORMATION The Dash Diet Eating Plan: www.dashdiet.org Document Released: 10/29/2011 Document Reviewed: 10/19/2011 [facility] Patient Information 2012 [place], [name].

## 2012-08-07 DIAGNOSIS — E669 Obesity, unspecified: Secondary | ICD-10-CM | POA: Insufficient documentation

## 2012-11-14 ENCOUNTER — Encounter: Payer: Self-pay | Admitting: Internal Medicine

## 2012-11-14 ENCOUNTER — Ambulatory Visit (INDEPENDENT_AMBULATORY_CARE_PROVIDER_SITE_OTHER): Payer: BC Managed Care – PPO | Admitting: Internal Medicine

## 2012-11-14 VITALS — BP 140/85 | HR 88 | Temp 98.1°F | Wt 228.0 lb

## 2012-11-14 DIAGNOSIS — F17200 Nicotine dependence, unspecified, uncomplicated: Secondary | ICD-10-CM

## 2012-11-14 DIAGNOSIS — Z8742 Personal history of other diseases of the female genital tract: Secondary | ICD-10-CM

## 2012-11-14 DIAGNOSIS — J988 Other specified respiratory disorders: Secondary | ICD-10-CM

## 2012-11-14 DIAGNOSIS — I10 Essential (primary) hypertension: Secondary | ICD-10-CM

## 2012-11-14 DIAGNOSIS — J22 Unspecified acute lower respiratory infection: Secondary | ICD-10-CM

## 2012-11-14 DIAGNOSIS — Z8759 Personal history of other complications of pregnancy, childbirth and the puerperium: Secondary | ICD-10-CM

## 2012-11-14 DIAGNOSIS — N926 Irregular menstruation, unspecified: Secondary | ICD-10-CM

## 2012-11-14 LAB — POCT URINE PREGNANCY: Preg Test, Ur: NEGATIVE

## 2012-11-14 NOTE — Patient Instructions (Signed)
Continue on  Twice a day medictaion  Continue to monitor your readings to make sure in range . Below 140/80  Continue to try stop tobacco. Chest is clear today .

## 2012-11-14 NOTE — Progress Notes (Signed)
Chief Complaint  Patient presents with  . Follow-up  . Hypertension    HPI: Patient comes in for followup of her hypertension switching over to methyldopa because wanting to get pregnant. Since her last visit she's done okay but did get a sinus infection Had sinusitis rx with amoxicillin  at urgent care center  [facility] .  Allergies still an issue  Inhaler once a day recently . No current fever or significant shortness of breath  Trying t stop tobacco . Now smoking about 1 pack per week. She is taking prenatal vitamins.  Her blood pressure readings she states is 128/80 120/70 135/85 it various doctor's appointments. And at home. Denies sig factor medication at this time.  Last menstrual period was a couple weeks ago but only lasted 2 days would like pregnancy test. ROS: See pertinent positives and negatives per HPI.  Past Medical History  Diagnosis Date  . Allergy   . Hypertension   . Generalized headaches   . Asthma   . Ectopic pregnancy Jan 2013  . Urinary tract infection   . Ovarian cyst     Family History  Problem Relation Age of Onset  . Liver disease Father   . Asthma Sister   . Asthma Sister   . Anesthesia problems Other     History   Social History  . Marital Status: Married    Spouse Name: N/A    Number of Children: N/A  . Years of Education: N/A   Social History Main Topics  . Smoking status: Current Some Day Smoker -- 3 years  . Smokeless tobacco: Never Used  . Alcohol Use: Yes     Comment: not curretnly  . Drug Use: No  . Sexually Active: Yes   Other Topics Concern  . None   Social History Narrative   Early Childhood educationKindergarten age groupHistory of miscarriage December 2011    Outpatient Encounter Prescriptions as of 11/14/2012  Medication Sig Dispense Refill  . acetaminophen (TYLENOL) 500 MG tablet Take 500-1,000 mg by mouth as needed. For Headache or pain.      Marland Kitchen albuterol (PROAIR HFA) 108 (90 BASE) MCG/ACT inhaler Inhale 2  puffs into the lungs every 6 (six) hours as needed.        . fluticasone (FLONASE) 50 MCG/ACT nasal spray Place 2 sprays into the nose daily. Each nostril  16 g  6  . loratadine (CLARITIN) 10 MG tablet Take 10 mg by mouth daily.      . methyldopa (ALDOMET) 500 MG tablet Take 1 tablet (500 mg total) by mouth 2 (two) times daily.  60 tablet  5  . Prenatal Vit-Fe Fumarate-FA (PRENATAL MULTIVITAMIN) TABS Take 1 tablet by mouth daily.        EXAM:  BP 140/85  Pulse 88  Temp 98.1 F (36.7 C)  Wt 228 lb (103.42 kg)  SpO2 98%  LMP 11/04/2012  There is no height on file to calculate BMI.  GENERAL: vitals reviewed and listed above, alert, oriented, appears well hydrated and in no acute distress  HEENT: atraumatic, conjunctiva  clear, no obvious abnormalities on inspection of external nose and ears OP : no lesion edema or exudate mildly congested  NECK: no obvious masses on inspection palpation   LUNGS: clear to auscultation bilaterally, , rales or rhonchi, good air movement no active wheezes  CV: HRRR, no clubbing cyanosis or  peripheral edema nl cap refill   MS: moves all extremities without noticeable focal  abnormality  PSYCH: pleasant and cooperative, no obvious depression or anxiety  ASSESSMENT AND PLAN:  Discussed the following assessment and plan:  1. HYPERTENSION  POCT Pregnancy, Urine   at goal readings at home  agine prefers  to stay the same and continue lsi   2. TOBACCO ABUSE  POCT Pregnancy, Urine  3. IRREGULAR MENSES  POCT Pregnancy, Urine, POCT urine pregnancy  4. Hx of ectopic pregnancy  POCT Pregnancy, Urine   Counseled about above. Weight loss lifestyle would be very helpful. -Patient advised to return or notify health care team  immediately if symptoms worsen or persist or new concerns arise.  Patient Instructions  Continue on  Twice a day medictaion  Continue to monitor your readings to make sure in range . Below 140/80  Continue to try stop tobacco. Chest  is clear today .   Neta Mends. Keimari  M.D.

## 2013-04-23 ENCOUNTER — Other Ambulatory Visit: Payer: Self-pay | Admitting: Internal Medicine

## 2013-04-27 NOTE — Telephone Encounter (Signed)
PT called to inquire about her RX refill and I explained to her that Dr. Fabian Sharp was out until next week, and she requested that another physician fill it due to the fact that she is completely out. Please assist.

## 2013-05-31 ENCOUNTER — Other Ambulatory Visit: Payer: Self-pay | Admitting: Internal Medicine

## 2013-06-02 NOTE — Telephone Encounter (Signed)
Have patient schedule appt rov  then refill  1 month ( #60)

## 2013-06-02 NOTE — Telephone Encounter (Signed)
Last filled on 04/23/13 #60 with 0 additional refills Last seen on 11/14/13 No future appt scheduled Please advsie. Thanks!

## 2013-06-23 ENCOUNTER — Ambulatory Visit (INDEPENDENT_AMBULATORY_CARE_PROVIDER_SITE_OTHER): Payer: BC Managed Care – PPO | Admitting: Internal Medicine

## 2013-06-23 ENCOUNTER — Encounter: Payer: Self-pay | Admitting: Internal Medicine

## 2013-06-23 VITALS — BP 130/86 | HR 68 | Temp 97.9°F | Wt 234.0 lb

## 2013-06-23 DIAGNOSIS — F17200 Nicotine dependence, unspecified, uncomplicated: Secondary | ICD-10-CM

## 2013-06-23 DIAGNOSIS — H9192 Unspecified hearing loss, left ear: Secondary | ICD-10-CM

## 2013-06-23 DIAGNOSIS — I10 Essential (primary) hypertension: Secondary | ICD-10-CM

## 2013-06-23 DIAGNOSIS — N926 Irregular menstruation, unspecified: Secondary | ICD-10-CM

## 2013-06-23 DIAGNOSIS — H9190 Unspecified hearing loss, unspecified ear: Secondary | ICD-10-CM

## 2013-06-23 LAB — BASIC METABOLIC PANEL
BUN: 9 mg/dL (ref 6–23)
Chloride: 105 mEq/L (ref 96–112)
Creatinine, Ser: 0.8 mg/dL (ref 0.4–1.2)
GFR: 110.22 mL/min (ref >60.00)

## 2013-06-23 LAB — CBC WITH DIFFERENTIAL/PLATELET
Basophils Absolute: 0 10*3/uL (ref 0.0–0.1)
Basophils Relative: 0.3 % (ref 0.0–3.0)
Eosinophils Absolute: 0.1 10*3/uL (ref 0.0–0.7)
Eosinophils Relative: 1.7 % (ref 0.0–5.0)
HCT: 35.8 % — ABNORMAL LOW (ref 36.0–46.0)
Hemoglobin: 11.9 g/dL — ABNORMAL LOW (ref 12.0–15.0)
Lymphocytes Relative: 29.5 % (ref 12.0–46.0)
Lymphs Abs: 2 10*3/uL (ref 0.7–4.0)
MCHC: 33.3 g/dL (ref 30.0–36.0)
MCV: 81.3 fl (ref 78.0–100.0)
Monocytes Absolute: 0.5 10*3/uL (ref 0.1–1.0)
Monocytes Relative: 7.1 % (ref 3.0–12.0)
Neutro Abs: 4.1 10*3/uL (ref 1.4–7.7)
Neutrophils Relative %: 61.4 % (ref 43.0–77.0)
Platelets: 365 10*3/uL (ref 150.0–400.0)
RBC: 4.4 Mil/uL (ref 3.87–5.11)
RDW: 15.5 % — ABNORMAL HIGH (ref 11.5–14.6)
WBC: 6.7 10*3/uL (ref 4.5–10.5)

## 2013-06-23 LAB — LIPID PANEL
Cholesterol: 143 mg/dL (ref 0–200)
HDL: 39.7 mg/dL (ref >39.00)
LDL Cholesterol: 96 mg/dL (ref 0–99)
Triglycerides: 37 mg/dL (ref 0.0–149.0)
VLDL: 7.4 mg/dL (ref 0.0–40.0)

## 2013-06-23 LAB — TSH: TSH: 1.61 u[IU]/mL (ref 0.35–5.50)

## 2013-06-23 LAB — HEPATIC FUNCTION PANEL
ALT: 23 U/L (ref 0–35)
Albumin: 4.2 g/dL (ref 3.5–5.2)
Total Protein: 7.3 g/dL (ref 6.0–8.3)

## 2013-06-23 LAB — T4, FREE: Free T4: 0.97 ng/dL (ref 0.60–1.60)

## 2013-06-23 LAB — HEMOGLOBIN A1C: Hgb A1c MFr Bld: 5.4 % (ref 4.6–6.5)

## 2013-06-23 NOTE — Progress Notes (Signed)
Chief Complaint  Patient presents with  . Hypertension    Decreased hearing left ear    HPI: Patient comes in for followup of her blood pressure and medication.  Since her last visit she's been taking Aldomet twice a day no significant side effects except some drowsiness. Checks blood pressure readings usually in control i. Highest 140/90 usually 130/80   Periods every month  TObacco  about 5 per day.  As cut down from one pack per day  Last ov 12 13    Complains of new onset over the last 2-3 months of decreased hearing in her left ear she has had a history of sinus problems and allergies but is not having one now. Would like to be referred for a hearing evaluation. No history of your tubes when younger. No specific treatment loud noises occasional ringing. ROS: See pertinent positives and negatives per HPI. No chest pain shortness of breath asthma flare.  Past Medical History  Diagnosis Date  . Allergy   . Hypertension   . Generalized headaches   . Asthma   . Ectopic pregnancy Jan 2013  . Urinary tract infection   . Ovarian cyst     Family History  Problem Relation Age of Onset  . Liver disease Father   . Asthma Sister   . Asthma Sister   . Anesthesia problems Other     History   Social History  . Marital Status: Married    Spouse Name: N/A    Number of Children: N/A  . Years of Education: N/A   Social History Main Topics  . Smoking status: Current Some Day Smoker -- 3 years  . Smokeless tobacco: Never Used  . Alcohol Use: Yes     Comment: not curretnly  . Drug Use: No  . Sexually Active: Yes   Other Topics Concern  . None   Social History Narrative   Early Childhood education   Kindergarten age group      History of miscarriage December 2011    Outpatient Encounter Prescriptions as of 06/23/2013  Medication Sig Dispense Refill  . acetaminophen (TYLENOL) 500 MG tablet Take 500-1,000 mg by mouth as needed. For Headache or pain.      Marland Kitchen albuterol  (PROAIR HFA) 108 (90 BASE) MCG/ACT inhaler Inhale 2 puffs into the lungs every 6 (six) hours as needed.        . fluticasone (FLONASE) 50 MCG/ACT nasal spray Place 2 sprays into the nose daily. Each nostril  16 g  6  . loratadine (CLARITIN) 10 MG tablet Take 10 mg by mouth daily.      . methyldopa (ALDOMET) 500 MG tablet take 1 tablet by mouth twice a day  60 tablet  0  . Prenatal Vit-Fe Fumarate-FA (PRENATAL MULTIVITAMIN) TABS Take 1 tablet by mouth daily.      . [DISCONTINUED] methyldopa (ALDOMET) 500 MG tablet Take 500 mg by mouth 2 (two) times daily.       No facility-administered encounter medications on file as of 06/23/2013.    EXAM:  BP 130/86  Pulse 68  Temp(Src) 97.9 F (36.6 C) (Oral)  Wt 234 lb (106.142 kg)  BMI 42.79 kg/m2  Body mass index is 42.79 kg/(m^2).  GENERAL: vitals reviewed and listed above, alert, oriented, appears well hydrated and in no acute distress looks mildly allergic. Nontoxic and well face is symmetrical HEENT: atraumatic, conjunctiva  clear, no obvious abnormalities on inspection of external nose and ears right TM intact normal left  TM normal bony landmarks there is an opaque round thickening or opacity to her pars tensa but otherwise normal OP : no lesion edema or exudate face nontender NECK: no obvious masses on inspection palpation no adenopathy LUNGS: clear to auscultation bilaterally, no wheezes, rales or rhonchi, good air movement CV: HRRR, no clubbing cyanosis or  peripheral edema nl cap refill  MS: moves all extremities without noticeable focal  Abnormality Skin no acute bruising or bleeding. PSYCH: pleasant and cooperative, no obvious depression or anxiety Lab Results  Component Value Date   WBC 6.7 06/23/2013   HGB 11.9* 06/23/2013   HCT 35.8* 06/23/2013   PLT 365.0 06/23/2013   GLUCOSE 84 06/23/2013   CHOL 143 06/23/2013   TRIG 37.0 06/23/2013   HDL 39.70 06/23/2013   LDLCALC 96 06/23/2013   ALT 23 06/23/2013   AST 19 06/23/2013   NA 139 06/23/2013   K  4.4 06/23/2013   CL 105 06/23/2013   CREATININE 0.8 06/23/2013   BUN 9 06/23/2013   CO2 25 06/23/2013   TSH 1.61 06/23/2013   HGBA1C 5.4 06/23/2013     ASSESSMENT AND PLAN:  Discussed the following assessment and plan:  Hypertension - Reasonably adequate control on Aldomet and tingling at this time - Plan: Basic metabolic panel, CBC with Differential, Hemoglobin A1c, Lipid panel, Hepatic function panel, TSH, T4, free  IRREGULAR MENSES - Plan: Basic metabolic panel, CBC with Differential, Hemoglobin A1c, Lipid panel, Hepatic function panel, TSH, T4, free  Decreased hearing of left ear - Uncertain cause uncertain TM exam question scarring ? ENT referral. - Plan: Basic metabolic panel, CBC with Differential, Hemoglobin A1c, Lipid panel, Hepatic function panel, TSH, T4, free  TOBACCO use disorder  - Has done better decrease continued attempts bleed cessation considering nicotine patches Counseling lifestyle intervention smoking cessation advised healthy eating diet exercise ENT referral -Patient advised to return or notify health care team  if symptoms worsen or persist or new concerns arise.  Patient Instructions  Continue lifestyle intervention healthy eating and exercise . Continue to try to stop tobacco. consdier the [place] quit line to help .   Check blood pressure readings occassionally to ensure control .  Will notify you  of labs when available. ROV in 6 months or as needed   You will be contacted about a referral to ear nose and throat doctor about the decreased hearing and problems with  left ear.     Neta Mends. Heavenlee Maiorana M.D.

## 2013-06-23 NOTE — Patient Instructions (Addendum)
Continue lifestyle intervention healthy eating and exercise . Continue to try to stop tobacco. consdier the [place] quit line to help .   Check blood pressure readings occassionally to ensure control .  Will notify you  of labs when available. ROV in 6 months or as needed   You will be contacted about a referral to ear nose and throat doctor about the decreased hearing and problems with  left ear.

## 2013-07-11 ENCOUNTER — Other Ambulatory Visit: Payer: Self-pay | Admitting: Internal Medicine

## 2013-07-11 NOTE — Telephone Encounter (Signed)
Pt following up on request for methyldopa (ALDOMET) 500 MG tablet [facility] / [address]

## 2013-09-27 ENCOUNTER — Other Ambulatory Visit: Payer: Self-pay | Admitting: Gynecology

## 2013-09-28 ENCOUNTER — Encounter (HOSPITAL_COMMUNITY): Payer: Self-pay | Admitting: *Deleted

## 2013-09-28 ENCOUNTER — Inpatient Hospital Stay (HOSPITAL_COMMUNITY)
Admission: AD | Admit: 2013-09-28 | Discharge: 2013-09-28 | Disposition: A | Discharge status: Home / Self Care | Payer: BC Managed Care – PPO | Source: Ambulatory Visit | Attending: Gynecology | Admitting: Gynecology

## 2013-09-28 DIAGNOSIS — O00109 Unspecified tubal pregnancy without intrauterine pregnancy: Secondary | ICD-10-CM | POA: Insufficient documentation

## 2013-09-28 LAB — ABO/RH: ABO/RH(D): O POS

## 2013-09-28 LAB — CBC WITH DIFFERENTIAL/PLATELET
Basophils Absolute: 0 10*3/uL (ref 0.0–0.1)
Basophils Relative: 0 % (ref 0–1)
Eosinophils Absolute: 0.2 10*3/uL (ref 0.0–0.7)
Hemoglobin: 12.1 g/dL (ref 12.0–15.0)
MCH: 26.5 pg (ref 26.0–34.0)
MCHC: 33.3 g/dL (ref 30.0–36.0)
Monocytes Relative: 7 % (ref 3–12)
Neutro Abs: 5.6 10*3/uL (ref 1.7–7.7)
Neutrophils Relative %: 61 % (ref 43–77)
RDW: 15 % (ref 11.5–15.5)

## 2013-09-28 LAB — CREATININE, SERUM: Creatinine, Ser: 0.71 mg/dL (ref 0.50–1.10)

## 2013-09-28 LAB — BUN: BUN: 8 mg/dL (ref 6–23)

## 2013-09-28 LAB — HCG, QUANTITATIVE, PREGNANCY: hCG, Beta Chain, Quant, S: 676 m[IU]/mL — ABNORMAL HIGH (ref <5)

## 2013-09-28 MED ORDER — METHOTREXATE INJECTION FOR WOMEN'S HOSPITAL
50.0000 mg/m2 | Freq: Once | INTRAMUSCULAR | Status: AC
  Administered 2013-09-28: 105 mg via INTRAMUSCULAR
  Filled 2013-09-28: qty 2.1

## 2013-09-28 NOTE — Progress Notes (Signed)
Dr. Marcelle Overlie informed BHCG results are not back due to delay with lab equipment.  MD states we can go ahead & give the MTX, Dr. Chevis Pretty has been following the pt's BHCG in the office.

## 2013-09-28 NOTE — MAU Note (Signed)
Pt sent here for MTX injection. Told she had and ectopic. Written orders sent from Dr. Valene Bors office.

## 2013-10-25 ENCOUNTER — Ambulatory Visit (INDEPENDENT_AMBULATORY_CARE_PROVIDER_SITE_OTHER): Payer: BC Managed Care – PPO | Admitting: Family Medicine

## 2013-10-25 ENCOUNTER — Encounter: Payer: Self-pay | Admitting: Family Medicine

## 2013-10-25 VITALS — BP 130/86 | HR 104 | Temp 99.3°F | Wt 229.0 lb

## 2013-10-25 DIAGNOSIS — J209 Acute bronchitis, unspecified: Secondary | ICD-10-CM

## 2013-10-25 MED ORDER — ALBUTEROL SULFATE HFA 108 (90 BASE) MCG/ACT IN AERS: 2.0000 | INHALATION_SPRAY | RESPIRATORY_TRACT | Status: DC | PRN

## 2013-10-25 MED ORDER — HYDROCODONE-HOMATROPINE 5-1.5 MG/5ML PO SYRP: 5.0000 mL | ORAL_SOLUTION | ORAL | Status: DC | PRN

## 2013-10-25 MED ORDER — AZITHROMYCIN 250 MG PO TABS: ORAL_TABLET | ORAL | Status: DC

## 2013-10-25 NOTE — Progress Notes (Signed)
Pre visit review using our clinic review tool, if applicable. No additional management support is needed unless otherwise documented below in the visit note. 

## 2013-10-25 NOTE — Progress Notes (Signed)
   Subjective:    Patient ID: Shirley Alvarez, female    DOB: 04-11-1981, 32 y.o.   MRN: [id number]  HPI Here for 2 weeks of PND, ST, and coughing up green sputum. Drinking fluids.    Review of Systems  Constitutional: Positive for fever.  HENT: Positive for congestion and postnasal drip. Negative for sinus pressure.   Eyes: Negative.   Respiratory: Positive for cough.        Objective:   Physical Exam  Constitutional: She appears well-developed and well-nourished.  HENT:  Right Ear: External ear normal.  Left Ear: External ear normal.  Nose: Nose normal.  Mouth/Throat: Oropharynx is clear and moist.  Eyes: Conjunctivae are normal.  Pulmonary/Chest: Effort normal. No respiratory distress. She has no wheezes. She has no rales.  Scattered rhonchi   Lymphadenopathy:    She has no cervical adenopathy.          Assessment & Plan:  Add Mucinex. Out of work today

## 2013-12-18 ENCOUNTER — Other Ambulatory Visit (HOSPITAL_COMMUNITY)
Admission: RE | Admit: 2013-12-18 | Discharge: 2013-12-18 | Disposition: A | Discharge status: Home / Self Care | Payer: BC Managed Care – PPO | Source: Ambulatory Visit | Attending: Obstetrics and Gynecology | Admitting: Obstetrics and Gynecology

## 2013-12-18 ENCOUNTER — Other Ambulatory Visit: Payer: Self-pay | Admitting: Obstetrics and Gynecology

## 2013-12-18 DIAGNOSIS — Z1151 Encounter for screening for human papillomavirus (HPV): Secondary | ICD-10-CM | POA: Insufficient documentation

## 2013-12-18 DIAGNOSIS — Z01419 Encounter for gynecological examination (general) (routine) without abnormal findings: Secondary | ICD-10-CM | POA: Insufficient documentation

## 2014-02-12 ENCOUNTER — Telehealth: Payer: Self-pay | Admitting: Internal Medicine

## 2014-02-12 NOTE — Telephone Encounter (Signed)
Noted  

## 2014-02-12 NOTE — Telephone Encounter (Signed)
Attempted call back and NA voice mail full.  Pt calling about increased [id number]

## 2014-02-16 ENCOUNTER — Encounter: Payer: Self-pay | Admitting: Internal Medicine

## 2014-02-16 ENCOUNTER — Ambulatory Visit (INDEPENDENT_AMBULATORY_CARE_PROVIDER_SITE_OTHER): Payer: BC Managed Care – PPO | Admitting: Internal Medicine

## 2014-02-16 VITALS — BP 124/82 | Temp 98.3°F | Ht 61.0 in | Wt 225.0 lb

## 2014-02-16 DIAGNOSIS — I10 Essential (primary) hypertension: Secondary | ICD-10-CM

## 2014-02-16 DIAGNOSIS — J309 Allergic rhinitis, unspecified: Secondary | ICD-10-CM

## 2014-02-16 DIAGNOSIS — N979 Female infertility, unspecified: Secondary | ICD-10-CM

## 2014-02-16 DIAGNOSIS — E669 Obesity, unspecified: Secondary | ICD-10-CM

## 2014-02-16 DIAGNOSIS — F17200 Nicotine dependence, unspecified, uncomplicated: Secondary | ICD-10-CM

## 2014-02-16 NOTE — Progress Notes (Signed)
Chief Complaint  Patient presents with  . Follow-up    HPI: Patient comes in today for SDA for fu problem evaluation. Here with partner husband today  On labetolol 400 bid now  and aldomet   And on both .plan is to decr the aldomet onto one med .  Check own readings   Last visit was 8 14 with labs   Last check labetolol  Feb 20  Per gyne  178/110.  Still tobacco about 5 per day. knwo she needs to stop.   Seasonal allergies not too bad yet   Periods still reg  Fertility rx perhaps in future   ROS: See pertinent positives and negatives per HPI.no co sob edema   Past Medical History  Diagnosis Date  . Allergy   . Hypertension   . Generalized headaches   . Asthma   . Ectopic pregnancy Jan 2013  . Urinary tract infection   . Ovarian cyst     Family History  Problem Relation Age of Onset  . Liver disease Father   . Asthma Sister   . Asthma Sister   . Anesthesia problems Other     History   Social History  . Marital Status: Married    Spouse Name: N/A    Number of Children: N/A  . Years of Education: N/A   Social History Main Topics  . Smoking status: Current Some Day Smoker -- 3 years  . Smokeless tobacco: Never Used  . Alcohol Use: No  . Drug Use: No  . Sexual Activity: Yes   Other Topics Concern  . None   Social History Narrative   Early Childhood education   Kindergarten age group      History of miscarriage December 2011    Outpatient Encounter Prescriptions as of 02/16/2014  Medication Sig  . acetaminophen (TYLENOL) 500 MG tablet Take 500-1,000 mg by mouth as needed. For Headache or pain.  Marland Kitchen albuterol (PROAIR HFA) 108 (90 BASE) MCG/ACT inhaler Inhale 2 puffs into the lungs every 4 (four) hours as needed for wheezing or shortness of breath.  . fluticasone (FLONASE) 50 MCG/ACT nasal spray Place 2 sprays into the nose daily. Each nostril  . labetalol (NORMODYNE) 200 MG tablet Take 400 mg by mouth 2 (two) times daily.  Marland Kitchen loratadine (CLARITIN) 10  MG tablet Take 10 mg by mouth daily.  . methyldopa (ALDOMET) 500 MG tablet take 1 tablet by mouth twice a day  . [DISCONTINUED] azithromycin (ZITHROMAX) 250 MG tablet As directed  . [DISCONTINUED] HYDROcodone-homatropine (HYDROMET) 5-1.5 MG/5ML syrup Take 5 mLs by mouth every 4 (four) hours as needed for cough.  . [DISCONTINUED] Prenatal Vit-Fe Fumarate-FA (PRENATAL MULTIVITAMIN) TABS Take 1 tablet by mouth daily.    EXAM:  BP 124/82  Temp(Src) 98.3 F (36.8 C) (Oral)  Ht 5\' 1"  (1.549 m)  Wt 225 lb (102.059 kg)  BMI 42.54 kg/m2  LMP 10/23/2013  Body mass index is 42.54 kg/(m^2). Bp repeated and confirmed 134/80 qne 138/70  GENERAL: vitals reviewed and listed above, alert, oriented, appears well hydrated and in no acute distress mildly allergic smells o tobacco smoke  In the room here with husband  HEENT: atraumatic, conjunctiva  clear, no obvious abnormalities on inspection of external nose and ears OP : no lesion edema or exudate  NECK: no obvious masses on inspection palpation  LUNGS: clear to auscultation bilaterally, no wheezes, rales or rhonchi, good air movement CV: HRRR, no clubbing cyanosis or  peripheral edema nl cap  refill  MS: moves all extremities without noticeable focal  abnormality PSYCH: pleasant and cooperative, no obvious depression or anxiety  ASSESSMENT AND PLAN:  Discussed the following assessment and plan:  Hypertension - controlled  consider wean of aldomet but no now contact us if needed lab due in aug sept if not done elsewhere  Tobacco use disorder - advise dc   Female infertility of unspecified origin  Obesity  Allergic rhinitis, cause unspecified - stable at present  -Patient advised to return or notify health care team  if symptoms worsen ,persist or new concerns arise.  Patient Instructions  Blood pressure is good today 134/80 and  Continue same   Contact us if we can help switch medications..  Due for labs tests chemistry in AUgust  September . Plan fu here at that time or contact us if  You have had blood tests done elsewhere for review.  Stopping tobacco is advised for health reasons.     Standley Brooking. Panosh M.D.  Pre visit review using our clinic review tool, if applicable. No additional management support is needed unless otherwise documented below in the visit note.

## 2014-02-16 NOTE — Patient Instructions (Signed)
Blood pressure is good today 134/80 and  Continue same   Contact us if we can help switch medications..  Due for labs tests chemistry in AUgust September . Plan fu here at that time or contact us if  You have had blood tests done elsewhere for review.  Stopping tobacco is advised for health reasons.

## 2014-02-17 ENCOUNTER — Telehealth: Payer: Self-pay | Admitting: Internal Medicine

## 2014-02-17 NOTE — Telephone Encounter (Signed)
Relevant patient education mailed to patient.  

## 2014-02-19 ENCOUNTER — Telehealth: Payer: Self-pay | Admitting: Internal Medicine

## 2014-02-19 NOTE — Telephone Encounter (Signed)
Relevant patient education assigned to patient using Emmi. ° °

## 2014-07-04 ENCOUNTER — Inpatient Hospital Stay (HOSPITAL_COMMUNITY): Payer: BC Managed Care – PPO

## 2014-07-04 ENCOUNTER — Encounter (HOSPITAL_COMMUNITY): Payer: Self-pay | Admitting: *Deleted

## 2014-07-04 ENCOUNTER — Inpatient Hospital Stay (HOSPITAL_COMMUNITY)
Admission: AD | Admit: 2014-07-04 | Discharge: 2014-07-04 | Discharge status: Against Medical Advice | Payer: BC Managed Care – PPO | Source: Ambulatory Visit | Attending: Obstetrics and Gynecology | Admitting: Obstetrics and Gynecology

## 2014-07-04 DIAGNOSIS — N949 Unspecified condition associated with female genital organs and menstrual cycle: Secondary | ICD-10-CM | POA: Insufficient documentation

## 2014-07-04 LAB — CBC WITH DIFFERENTIAL/PLATELET
Basophils Absolute: 0 10*3/uL (ref 0.0–0.1)
Basophils Relative: 0 % (ref 0–1)
EOS ABS: 0.2 10*3/uL (ref 0.0–0.7)
Eosinophils Relative: 2 % (ref 0–5)
HCT: 33.4 % — ABNORMAL LOW (ref 36.0–46.0)
Hemoglobin: 11.3 g/dL — ABNORMAL LOW (ref 12.0–15.0)
LYMPHS ABS: 3.1 10*3/uL (ref 0.7–4.0)
LYMPHS PCT: 29 % (ref 12–46)
MCH: 28.1 pg (ref 26.0–34.0)
MCHC: 33.8 g/dL (ref 30.0–36.0)
MCV: 83.1 fL (ref 78.0–100.0)
Monocytes Absolute: 0.9 10*3/uL (ref 0.1–1.0)
Monocytes Relative: 8 % (ref 3–12)
NEUTROS ABS: 6.6 10*3/uL (ref 1.7–7.7)
NEUTROS PCT: 61 % (ref 43–77)
PLATELETS: 320 10*3/uL (ref 150–400)
RBC: 4.02 MIL/uL (ref 3.87–5.11)
RDW: 14.8 % (ref 11.5–15.5)
WBC: 10.8 10*3/uL — AB (ref 4.0–10.5)

## 2014-07-04 LAB — BUN: BUN: 9 mg/dL (ref 6–23)

## 2014-07-04 LAB — CREATININE, SERUM
CREATININE: 0.77 mg/dL (ref 0.50–1.10)
GFR calc Af Amer: >90 mL/min (ref >90)
GFR calc non Af Amer: >90 mL/min (ref >90)

## 2014-07-04 LAB — AST: AST: 20 U/L (ref 0–37)

## 2014-07-04 LAB — HCG, QUANTITATIVE, PREGNANCY: hCG, Beta Chain, Quant, S: 3906 m[IU]/mL — ABNORMAL HIGH (ref <5)

## 2014-07-04 NOTE — MAU Note (Signed)
Sent from office for [place] only, r/o ectopic.  Has had some cramping and some bleeding.

## 2014-07-04 NOTE — MAU Provider Note (Signed)
Shirley Alvarez is a 33 y.o. G4P0010 at [redacted]w[redacted]d who presents to MAU today from the office for [place] to rule out ectopic. [place] in the office was suspicious for ectopic pregnancy and patient has a history of 2 previous ectopics.   BP 140/80  Pulse 88  Temp(Src) 99.6 F (37.6 C) (Oral)  Resp 18  Ht 5' 0.25" (1.53 m)  Wt 236 lb (107.049 kg)  BMI 45.73 kg/m2  LMP 06/07/2014  Breastfeeding? Unknown  Patient left prior to exam  Results for orders placed during the hospital encounter of 07/04/14 (from the past 24 hour(s))  HCG, QUANTITATIVE, PREGNANCY     Status: Abnormal   Collection Time    07/04/14  9:05 PM      Result Value Ref Range   hCG, Beta Chain, Quant, S 3906 (*) <5 mIU/mL  CBC WITH DIFFERENTIAL     Status: Abnormal   Collection Time    07/04/14  9:05 PM      Result Value Ref Range   WBC 10.8 (*) 4.0 - 10.5 K/uL   RBC 4.02  3.87 - 5.11 MIL/uL   Hemoglobin 11.3 (*) 12.0 - 15.0 g/dL   HCT 33.4 (*) 36.0 - 46.0 %   MCV 83.1  78.0 - 100.0 fL   MCH 28.1  26.0 - 34.0 pg   MCHC 33.8  30.0 - 36.0 g/dL   RDW 14.8  11.5 - 15.5 %   Platelets 320  150 - 400 K/uL   Neutrophils Relative % 61  43 - 77 %   Neutro Abs 6.6  1.7 - 7.7 K/uL   Lymphocytes Relative 29  12 - 46 %   Lymphs Abs 3.1  0.7 - 4.0 K/uL   Monocytes Relative 8  3 - 12 %   Monocytes Absolute 0.9  0.1 - 1.0 K/uL   Eosinophils Relative 2  0 - 5 %   Eosinophils Absolute 0.2  0.0 - 0.7 K/uL   Basophils Relative 0  0 - 1 %   Basophils Absolute 0.0  0.0 - 0.1 K/uL  AST     Status: None   Collection Time    07/04/14  9:05 PM      Result Value Ref Range   AST 20  0 - 37 U/L  BUN     Status: None   Collection Time    07/04/14  9:05 PM      Result Value Ref Range   BUN 9  6 - 23 mg/dL  CREATININE, SERUM     Status: None   Collection Time    07/04/14  9:05 PM      Result Value Ref Range   Creatinine, Ser 0.77  0.50 - 1.10 mg/dL   GFR calc non Af Amer >90  >90 mL/min   GFR calc Af Amer >90  >90 mL/min    US Ob  Comp Less 14 Wks  07/04/2014   CLINICAL DATA:  Pelvic pain. Spotting. Evaluate for ectopic pregnancy.  EXAM: OBSTETRIC <14 WK [place] AND TRANSVAGINAL OB US  TECHNIQUE: Both transabdominal and transvaginal ultrasound examinations were performed for complete evaluation of the gestation as well as the maternal uterus, adnexal regions, and pelvic cul-de-sac. Transvaginal technique was performed to assess early pregnancy.  COMPARISON:  12/10/2011.  FINDINGS: Intrauterine gestational sac: None.  Yolk sac:  None.  Embryo:  None.  Cardiac Activity: None.  Heart Rate:  N/A  Maternal uterus/adnexae: Multiple focal uterine lesions of heterogeneous echotexture, compatible with  leiomyomas, wall measuring 1.8 cm in diameter or less. Left ovary was not visualized. Within the right ovary there are multiple lesions. The largest of these measures 7.8 x 4.0 x 6.4 cm and is anechoic with increased through transmission, compatible with a simple cyst. There is another lesion measuring 5.1 x 3.6 x 4.2 cm which has internal debris or soft tissue and is highly complex in appearance. This lesion demonstrates no evidence of internal blood flow. Another complex appearing cystic lesion measures 4.6 x 5.0 x 5.0 cm, also with extensive internal soft tissue or debris, but no internal blood flow. Another smaller complex appearing cyst also measures 4.5 x 2.9 x 3.1 cm.  IMPRESSION: 1. No IUP identified. 2. Limited study which was not able to visualize the left ovary. 3. Multiple complex cystic lesions in the right ovary strongly favored to represent hemorrhagic cysts. Repeat evaluation with pelvic ultrasound in 6-12 weeks is recommended to ensure the resolution of these findings.   Electronically Signed   By: Vinnie Langton M.D.   On: 07/04/2014 19:49   US Ob Transvaginal  07/04/2014   CLINICAL DATA:  Pelvic pain. Spotting. Evaluate for ectopic pregnancy.  EXAM: OBSTETRIC <14 WK [place] AND TRANSVAGINAL OB US  TECHNIQUE: Both transabdominal and  transvaginal ultrasound examinations were performed for complete evaluation of the gestation as well as the maternal uterus, adnexal regions, and pelvic cul-de-sac. Transvaginal technique was performed to assess early pregnancy.  COMPARISON:  12/10/2011.  FINDINGS: Intrauterine gestational sac: None.  Yolk sac:  None.  Embryo:  None.  Cardiac Activity: None.  Heart Rate:  N/A  Maternal uterus/adnexae: Multiple focal uterine lesions of heterogeneous echotexture, compatible with leiomyomas, wall measuring 1.8 cm in diameter or less. Left ovary was not visualized. Within the right ovary there are multiple lesions. The largest of these measures 7.8 x 4.0 x 6.4 cm and is anechoic with increased through transmission, compatible with a simple cyst. There is another lesion measuring 5.1 x 3.6 x 4.2 cm which has internal debris or soft tissue and is highly complex in appearance. This lesion demonstrates no evidence of internal blood flow. Another complex appearing cystic lesion measures 4.6 x 5.0 x 5.0 cm, also with extensive internal soft tissue or debris, but no internal blood flow. Another smaller complex appearing cyst also measures 4.5 x 2.9 x 3.1 cm.  IMPRESSION: 1. No IUP identified. 2. Limited study which was not able to visualize the left ovary. 3. Multiple complex cystic lesions in the right ovary strongly favored to represent hemorrhagic cysts. Repeat evaluation with pelvic ultrasound in 6-12 weeks is recommended to ensure the resolution of these findings.   Electronically Signed   By: Vinnie Langton M.D.   On: 07/04/2014 19:49    MDM Discussed [place] results with Dr. Landry Mellow. Recommends quant hCG and MTX labs while in MAU today Discussed lab results with Dr. Landry Mellow. Recommends MTX today.  Went to discuss option of MTX with the patient. She has left MAU at this time. Attempted to contact patient by phone and there was no answer and I was unable to leave a message because the mailbox is full.  Patient returned call  to MAU. Discussed results and concern for possible ectopic pregnancy. She states that she will wait to discuss management with Dr. Simona Huh at her scheduled appointment tomorrow.  Discussed risk of damage to the tube if an ectopic pregnancy were to progress. Patient states understanding and will discuss with Dr. Simona Huh tomorrow or return to MAU  sooner if symptoms worsen.   A: Concern for ectopic pregnancy  P: Discharge home Patient advised to follow-up with Dr. Simona Huh tomorrow as scheduled for further management Ectopic precautions discussed Patient may return to MAU as needed or if her condition were to change or worsen   Farris Has, PA-C 07/04/2014 10:25 PM

## 2014-07-04 NOTE — MAU Note (Signed)
Patient left after blood draw. Provider notified and patient called with results

## 2014-07-06 ENCOUNTER — Inpatient Hospital Stay (HOSPITAL_COMMUNITY)
Admission: AD | Admit: 2014-07-06 | Discharge: 2014-07-06 | Disposition: A | Discharge status: Home / Self Care | Payer: BC Managed Care – PPO | Source: Ambulatory Visit | Attending: Obstetrics and Gynecology | Admitting: Obstetrics and Gynecology

## 2014-07-06 DIAGNOSIS — F17200 Nicotine dependence, unspecified, uncomplicated: Secondary | ICD-10-CM | POA: Insufficient documentation

## 2014-07-06 DIAGNOSIS — O009 Unspecified ectopic pregnancy without intrauterine pregnancy: Secondary | ICD-10-CM | POA: Diagnosis not present

## 2014-07-06 DIAGNOSIS — O00109 Unspecified tubal pregnancy without intrauterine pregnancy: Secondary | ICD-10-CM | POA: Insufficient documentation

## 2014-07-06 LAB — HCG, QUANTITATIVE, PREGNANCY: hCG, Beta Chain, Quant, S: 3976 m[IU]/mL — ABNORMAL HIGH (ref <5)

## 2014-07-06 MED ORDER — METHOTREXATE INJECTION FOR WOMEN'S HOSPITAL
50.0000 mg/m2 | Freq: Once | INTRAMUSCULAR | Status: AC
  Administered 2014-07-06: 110 mg via INTRAMUSCULAR
  Filled 2014-07-06: qty 2.2

## 2014-07-06 NOTE — MAU Provider Note (Signed)
History     CSN: [id number]  Arrival date and time: 07/06/14 1922   None     Chief Complaint  Patient presents with  . Ectopic Pregnancy   HPI  Shirley Alvarez is a 33 y.o. G4P0010 at [redacted]w[redacted]d who was sent over from the office for MTX therapy for an ectopic pregnancy. She was seen here two days ago, and was to have MTX at that visit. However, she decided to wait a little longer. She has continued to have off and on pain and spotting.   Past Medical History  Diagnosis Date  . Allergy   . Hypertension   . Generalized headaches   . Asthma   . Ectopic pregnancy Jan 2013  . Urinary tract infection   . Ovarian cyst     Past Surgical History  Procedure Laterality Date  . No past surgeries      Family History  Problem Relation Age of Onset  . Liver disease Father   . Asthma Sister   . Asthma Sister   . Anesthesia problems Other     History  Substance Use Topics  . Smoking status: Current Some Day Smoker -- 3 years  . Smokeless tobacco: Never Used  . Alcohol Use: No    Allergies: No Known Allergies  Prescriptions prior to admission  Medication Sig Dispense Refill  . acetaminophen (TYLENOL) 500 MG tablet Take 500-1,000 mg by mouth as needed. For Headache or pain.      Marland Kitchen albuterol (PROAIR HFA) 108 (90 BASE) MCG/ACT inhaler Inhale 2 puffs into the lungs every 4 (four) hours as needed for wheezing or shortness of breath.  1 Inhaler  0  . labetalol (NORMODYNE) 200 MG tablet Take 400 mg by mouth 2 (two) times daily.      . methyldopa (ALDOMET) 500 MG tablet take 1 tablet by mouth twice a day  60 tablet  6    ROS Physical Exam   Height 5\' 1"  (1.549 m), weight 107.593 kg (237 lb 3.2 oz), last menstrual period 06/07/2014, unknown if currently breastfeeding.  Physical Exam  Nursing note and vitals reviewed. Constitutional: She is oriented to person, place, and time. She appears well-developed and well-nourished. No distress.  Cardiovascular: Normal rate.    Respiratory: Effort normal.  GI: Soft. There is no tenderness. There is no rebound.  Neurological: She is alert and oriented to person, place, and time.  Skin: Skin is warm and dry.  Psychiatric: She has a normal mood and affect.    MAU Course  Procedures Results for orders placed during the hospital encounter of 07/06/14 (from the past 48 hour(s))  HCG, QUANTITATIVE, PREGNANCY     Status: Abnormal   Collection Time    07/06/14  8:30 PM      Result Value Ref Range   hCG, Beta Chain, Quant, S 3976 (*) <5 mIU/mL   Comment:              GEST. AGE      CONC.  (mIU/mL)       <=1 WEEK        5 - 50         2 WEEKS       50 - 500         3 WEEKS       100 - 10,000         4 WEEKS     1,000 - 30,000         5  WEEKS     3,500 - 115,000       6-8 WEEKS     12,000 - 270,000        12 WEEKS     15,000 - 220,000                FEMALE AND NON-PREGNANT FEMALE:         LESS THAN 5 mIU/mL   2000: D/W Dr. Simona Huh, ok to proceed with MTX does not need to repeat CMET today. Repeat HCG, and have patient follow here for HCG checks.  Assessment and Plan   1. Ectopic pregnancy without intrauterine pregnancy    Bleeding precautions Ectopic precautions Return to MAU as needed  Follow-up Information   Follow up with [facility]. ([facility]. )    Contact information:   [address] [id number] [place] [address] [phone]       Mathis Bud 07/06/2014, 9:02 PM

## 2014-07-06 NOTE — Discharge Instructions (Signed)
Ectopic Pregnancy °An ectopic pregnancy happens when a fertilized egg grows outside the uterus. A pregnancy cannot live outside of the uterus. This problem often happens in the fallopian tube. It is often caused by damage to the fallopian tube. °If this problem is found early, you may be treated with medicine. If your tube tears or bursts open (ruptures), you will bleed inside. This is an emergency. You will need surgery. Get help right away.  °SYMPTOMS °You may have normal pregnancy symptoms at first. These include: °· Missing your period. °· Feeling sick to your stomach (nauseous). °· Being tired. °· Having tender breasts. °Then, you may start to have symptoms that are not normal. These include: °· Pain with sex (intercourse). °· Bleeding from the vagina. This includes light bleeding (spotting). °· Belly (abdomen) or lower belly cramping or pain. This may be felt on one side. °· A fast heartbeat (pulse). °· Passing out (fainting) after going poop (bowel movement). °If your tube tears, you may have symptoms such as: °· Really bad pain in the belly or lower belly. This happens suddenly. °· Dizziness. °· Passing out. °· Shoulder pain. °GET HELP RIGHT AWAY IF:  °You have any of these symptoms. This is an emergency. °MAKE SURE YOU: °· Understand these instructions. °· Will watch your condition. °· Will get help right away if you are not doing well or get worse. °Document Released: 02/05/2009 Document Revised: 11/14/2013 Document Reviewed: 06/21/2013 °ExitCare® Patient Information ©2015 [employer]. This information is not intended to replace advice given to you by your health care provider. Make sure you discuss any questions you have with your health care provider. ° °Methotrexate Treatment for an Ectopic Pregnancy, Care After °Refer to this sheet in the next few weeks. These instructions provide you with information on caring for yourself after your procedure. Your health care provider may also give you more  specific instructions. Your treatment has been planned according to current medical practices, but problems sometimes occur. Call your health care provider if you have any problems or questions after your procedure. °WHAT TO EXPECT AFTER THE PROCEDURE °You may have some abdominal cramping, vaginal bleeding, and fatigue in the first few days after taking methotrexate. Some other possible side effects of methotrexate include: °· Nausea. °· Vomiting. °· Diarrhea. °· Mouth sores. °· Swelling or irritation of the lining of your lungs (pneumonitis). °· Liver damage. °· Hair loss. °HOME CARE INSTRUCTIONS  °After you have received the methotrexate medicine, you need to be careful of your activities and watch your condition for several weeks. It may take 1 week before your hormone levels return to normal. °· Keep all follow-up appointments as directed by your health care provider. °· Avoid traveling too far away from your health care provider. °· Do not have sexual intercourse until your health care provider says it is safe to do so. °· You may resume your usual diet. °· Limit strenuous activity. °· Do not take folic acid, prenatal vitamins, or other vitamins that contain folic acid. °· Do not take aspirin, ibuprofen, or naproxen (nonsteroidal anti-inflammatory drugs [NSAIDs]). °· Do not drink alcohol. °SEEK MEDICAL CARE IF:  °· You cannot control your nausea and vomiting. °· You cannot control your diarrhea. °· You have sores in your mouth and want treatment. °· You need pain medicine for your abdominal pain. °· You have a rash. °· You are having a reaction to the medicine. °SEEK IMMEDIATE MEDICAL CARE IF:  °· You have increasing abdominal or pelvic pain. °·   You notice increased bleeding. °· You feel light-headed, or you faint. °· You have shortness of breath. °· Your heart rate increases. °· You have a cough. °· You have chills. °· You have a fever. °Document Released: 10/29/2011 Document Revised: 11/14/2013 Document  Reviewed: 08/28/2013 °ExitCare® Patient Information ©2015 [employer]. This information is not intended to replace advice given to you by your health care provider. Make sure you discuss any questions you have with your health care provider. ° °

## 2014-07-06 NOTE — MAU Note (Addendum)
Suspected ectopic and here for MTX. Dr Simona Huh told me to come. This is my 3rd ectopic and had MTX last 2  Pregnancies. Off and on pain and some spotting. No pain currently

## 2014-07-07 NOTE — MAU Provider Note (Signed)
Reviewed

## 2014-07-09 ENCOUNTER — Inpatient Hospital Stay (HOSPITAL_COMMUNITY)
Admission: AD | Admit: 2014-07-09 | Discharge: 2014-07-09 | Disposition: A | Discharge status: Home / Self Care | Payer: BC Managed Care – PPO | Source: Ambulatory Visit | Attending: Obstetrics and Gynecology | Admitting: Obstetrics and Gynecology

## 2014-07-09 DIAGNOSIS — O00109 Unspecified tubal pregnancy without intrauterine pregnancy: Secondary | ICD-10-CM | POA: Diagnosis not present

## 2014-07-09 DIAGNOSIS — F17200 Nicotine dependence, unspecified, uncomplicated: Secondary | ICD-10-CM | POA: Insufficient documentation

## 2014-07-09 DIAGNOSIS — I10 Essential (primary) hypertension: Secondary | ICD-10-CM | POA: Insufficient documentation

## 2014-07-09 DIAGNOSIS — O009 Unspecified ectopic pregnancy without intrauterine pregnancy: Secondary | ICD-10-CM

## 2014-07-09 LAB — HCG, QUANTITATIVE, PREGNANCY: hCG, Beta Chain, Quant, S: 4038 m[IU]/mL — ABNORMAL HIGH (ref <5)

## 2014-07-09 NOTE — MAU Provider Note (Signed)
History     CSN: [id number]  Arrival date and time: 07/09/14 1932   None     No chief complaint on file.  HPI Shirley Alvarez is a 33 y.o. a G4P0010 at [redacted]w[redacted]d who is here for [facility] for day #4 s/p MTX. She states that she has continued to have pain at the same level as it was initially. No increase in pain at this time. She has had a slight increase in her bleeding. She states that it is not as heavy as period.    Past Medical History  Diagnosis Date  . Allergy   . Hypertension   . Generalized headaches   . Asthma   . Ectopic pregnancy Jan 2013  . Urinary tract infection   . Ovarian cyst     Past Surgical History  Procedure Laterality Date  . No past surgeries      Family History  Problem Relation Age of Onset  . Liver disease Father   . Asthma Sister   . Asthma Sister   . Anesthesia problems Other     History  Substance Use Topics  . Smoking status: Current Some Day Smoker -- 3 years  . Smokeless tobacco: Never Used  . Alcohol Use: No    Allergies: No Known Allergies  Prescriptions prior to admission  Medication Sig Dispense Refill  . acetaminophen (TYLENOL) 500 MG tablet Take 1,000 mg by mouth 4 (four) times daily as needed for headache.       . albuterol (PROAIR HFA) 108 (90 BASE) MCG/ACT inhaler Inhale 2 puffs into the lungs every 4 (four) hours as needed for wheezing or shortness of breath.  1 Inhaler  0  . ibuprofen (ADVIL,MOTRIN) 800 MG tablet Take 800 mg by mouth 2 (two) times daily as needed (for menstrual pain.).       Marland Kitchen labetalol (NORMODYNE) 200 MG tablet Take 400 mg by mouth 2 (two) times daily.      . methyldopa (ALDOMET) 500 MG tablet take 1 tablet by mouth twice a day  60 tablet  6  . progesterone (PROMETRIUM) 200 MG capsule Place 200 mg vaginally at bedtime.        ROS Physical Exam   Blood pressure 125/84, pulse 77, temperature 99.3 F (37.4 C), temperature source Oral, resp. rate 20, height 5\' 1"  (1.549 m), weight 106.255 kg (234 lb 4  oz), last menstrual period 06/07/2014, unknown if currently breastfeeding.  Physical Exam  Nursing note and vitals reviewed. Constitutional: She is oriented to person, place, and time. She appears well-developed and well-nourished. No distress.  Cardiovascular: Normal rate.   Respiratory: Effort normal.  GI: Soft. There is no tenderness.  Neurological: She is alert and oriented to person, place, and time.  Skin: Skin is warm and dry.  Psychiatric: She has a normal mood and affect.    MAU Course  Procedures  Results for ANYE, BROSE (MRN [id number]) as of 07/09/2014 21:11  Ref. Range 07/06/2014 20:30 07/09/2014 20:10  hCG, Beta Chain, Quant, S Latest Range: <5 mIU/mL 3976 (H) 4038 (H)   2123: D/W Dr. Simona Huh, ok for dc home FU Thursday for day #7 labs Assessment and Plan   1. Ectopic pregnancy    Ectopic precautions reviewed Return to MAU as needed or on Thursday 8/20 for repeat HCG.   Follow-up Information   Follow up with [facility]. (THURSDAY 07/12/14 )    Contact information:   [address] [id number] mc  [place] [address] [phone]       Mathis Bud 07/09/2014, 9:11 PM

## 2014-07-09 NOTE — MAU Note (Addendum)
PT SAYS SHE WAS HERE ON Friday -- RECEIVED METHOTREXATE-     TOLD TO RETURN  TONIGHT.      SAME PAIN IS SAME AS HAS BEEN -  4.  TODAY WENT BACK TO WORK-  SO BACK HURTS.   HAS VAG BLEEDING -  IS MORE THAN  WAS ON Friday-   IN TRIAGE-    PAD ON SINCE   0600- SMALL AMT DARK  ON PAD-   VERY ABSORBANT .      TODAY - TOOK NOTHING FOR PAIN

## 2014-07-09 NOTE — MAU Note (Signed)
PLAN IS   TO RETURN ON   Thursday  TO REPEAT HCG   AS DAY 7

## 2014-07-09 NOTE — Discharge Instructions (Signed)
Ectopic Pregnancy °An ectopic pregnancy is when the fertilized egg attaches (implants) outside the uterus. Most ectopic pregnancies occur in the fallopian tube. Rarely do ectopic pregnancies occur on the ovary, intestine, pelvis, or cervix. In an ectopic pregnancy, the fertilized egg does not have the ability to develop into a normal, healthy baby.  °A ruptured ectopic pregnancy is one in which the fallopian tube gets torn or bursts and results in internal bleeding. Often there is intense abdominal pain, and sometimes, vaginal bleeding. Having an ectopic pregnancy can be life threatening. If left untreated, this dangerous condition can lead to a blood transfusion, abdominal surgery, or even death. °CAUSES  °Damage to the fallopian tubes is the suspected cause in most ectopic pregnancies.  °RISK FACTORS °Depending on your circumstances, the risk of having an ectopic pregnancy will vary. The level of risk can be divided into three categories. °High Risk °· You have gone through infertility treatment. °· You have had a previous ectopic pregnancy. °· You have had previous tubal surgery. °· You have had previous surgery to have the fallopian tubes tied (tubal ligation). °· You have tubal problems or diseases. °· You have been exposed to DES. DES is a medicine that was used until 1971 and had effects on babies whose mothers took the medicine. °· You become pregnant while using an intrauterine device (IUD) for birth control.  °Moderate Risk °· You have a history of infertility. °· You have a history of a sexually transmitted infection (STI). °· You have a history of pelvic inflammatory disease (PID). °· You have scarring from endometriosis. °· You have multiple sexual partners. °· You smoke.  °Low Risk °· You have had previous pelvic surgery. °· You use vaginal douching. °· You became sexually active before 33 years of age. °SIGNS AND SYMPTOMS  °An ectopic pregnancy should be suspected in anyone who has missed a period and  has abdominal pain or bleeding. °· You may experience normal pregnancy symptoms, such as: °¨ Nausea. °¨ Tiredness. °¨ Breast tenderness. °· Other symptoms may include: °¨ Pain with intercourse. °¨ Irregular vaginal bleeding or spotting. °¨ Cramping or pain on one side or in the lower abdomen. °¨ Fast heartbeat. °¨ Passing out while having a bowel movement. °· Symptoms of a ruptured ectopic pregnancy and internal bleeding may include: °¨ Sudden, severe pain in the abdomen and pelvis. °¨ Dizziness or fainting. °¨ Pain in the shoulder area. °DIAGNOSIS  °Tests that may be performed include: °· A pregnancy test. °· An ultrasound test. °· Testing the specific level of pregnancy hormone in the bloodstream. °· Taking a sample of uterus tissue (dilation and curettage, D&C). °· Surgery to perform a visual exam of the inside of the abdomen using a thin, lighted tube with a tiny camera on the end (laparoscope). °TREATMENT  °An injection of a medicine called methotrexate may be given. This medicine causes the pregnancy tissue to be absorbed. It is given if: °· The diagnosis is made early. °· The fallopian tube has not ruptured. °· You are considered to be a good candidate for the medicine. °Usually, pregnancy hormone blood levels are checked after methotrexate treatment. This is to be sure the medicine is effective. It may take 4-6 weeks for the pregnancy to be absorbed (though most pregnancies will be absorbed by 3 weeks). °Surgical treatment may be needed. A laparoscope may be used to remove the pregnancy tissue. If severe internal bleeding occurs, a cut (incision) may be made in the lower abdomen (laparotomy), and the ectopic   pregnancy is removed. This stops the bleeding. Part of the fallopian tube, or the whole tube, may be removed as well (salpingectomy). After surgery, pregnancy hormone tests may be done to be sure there is no pregnancy tissue left. You may receive a Rho (D) immune globulin shot if you are Rh negative and  the father is Rh positive, or if you do not know the Rh type of the father. This is to prevent problems with any future pregnancy. °SEEK IMMEDIATE MEDICAL CARE IF:  °You have any symptoms of an ectopic pregnancy. This is a medical emergency. °MAKE SURE YOU: °· Understand these instructions. °· Will watch your condition. °· Will get help right away if you are not doing well or get worse. °Document Released: 12/17/2004 Document Revised: 03/26/2014 Document Reviewed: 06/08/2013 °ExitCare® Patient Information ©2015 [employer]. This information is not intended to replace advice given to you by your health care provider. Make sure you discuss any questions you have with your health care provider. °Methotrexate Treatment for an Ectopic Pregnancy °Methotrexate is a medicine that treats ectopic pregnancy by stopping the growth of the fertilized egg. It also helps your body absorb tissue from the egg. This takes between 2 weeks and 6 weeks. Most ectopic pregnancies can be successfully treated with methotrexate if they are detected early enough. °LET YOUR HEALTH CARE PROVIDER KNOW ABOUT: °· Any allergies you have. °· All medicines you are taking, including vitamins, herbs, eye drops, creams, and over-the-counter medicines. °· Medical conditions you have. °RISKS AND COMPLICATIONS °Generally, this is a safe treatment. However, as with any treatment, problems can occur. Possible problems or side effects include: °· Nausea. °· Vomiting. °· Diarrhea. °· Abdominal cramping. °· Mouth sores. °· Increased vaginal bleeding or spotting.   °· Swelling or irritation of the lining of your lungs (pneumonitis).  °· Failed treatment and continuation of the pregnancy.   °· Liver damage. °· Hair loss. °There is still a risk of the ectopic pregnancy rupturing while using the methotrexate. °BEFORE THE PROCEDURE °Before you take the medicine:  °· Liver tests, kidney tests, and a complete blood test are performed. °· Blood tests are performed to  measure the pregnancy hormone levels and to determine your blood type. °· If you are Rh-negative and the father is Rh-positive or his Rh type is not known, you will be given a Rho (D) immune globulin shot. °PROCEDURE  °There are two methods that your health care provider may use to prescribe methotrexate. One method involves a single dose or injection of the medicine. Another method involves a series of doses given through several injections.  °AFTER THE PROCEDURE °· You may have some abdominal cramping, vaginal bleeding, and fatigue in the first few days after taking methotrexate. °· Blood tests will be taken for several weeks to check the pregnancy hormone levels. The blood tests are performed until there is no more pregnancy hormone detected in the blood. °Document Released: 11/03/2001 Document Revised: 03/26/2014 Document Reviewed: 08/28/2013 °ExitCare® Patient Information ©2015 [employer]. This information is not intended to replace advice given to you by your health care provider. Make sure you discuss any questions you have with your health care provider. ° °

## 2014-07-12 ENCOUNTER — Inpatient Hospital Stay (HOSPITAL_COMMUNITY)
Admission: AD | Admit: 2014-07-12 | Discharge: 2014-07-12 | Disposition: A | Discharge status: Home / Self Care | Payer: BC Managed Care – PPO | Source: Ambulatory Visit | Attending: Obstetrics and Gynecology | Admitting: Obstetrics and Gynecology

## 2014-07-12 DIAGNOSIS — O00109 Unspecified tubal pregnancy without intrauterine pregnancy: Secondary | ICD-10-CM | POA: Insufficient documentation

## 2014-07-12 DIAGNOSIS — O009 Unspecified ectopic pregnancy without intrauterine pregnancy: Secondary | ICD-10-CM | POA: Diagnosis not present

## 2014-07-12 LAB — HCG, QUANTITATIVE, PREGNANCY: hCG, Beta Chain, Quant, S: 2963 m[IU]/mL — ABNORMAL HIGH (ref <5)

## 2014-07-12 NOTE — Discharge Instructions (Signed)
Ectopic Pregnancy °An ectopic pregnancy happens when a fertilized egg grows outside the uterus. A pregnancy cannot live outside of the uterus. This problem often happens in the fallopian tube. It is often caused by damage to the fallopian tube. °If this problem is found early, you may be treated with medicine. If your tube tears or bursts open (ruptures), you will bleed inside. This is an emergency. You will need surgery. Get help right away.  °SYMPTOMS °You may have normal pregnancy symptoms at first. These include: °· Missing your period. °· Feeling sick to your stomach (nauseous). °· Being tired. °· Having tender breasts. °Then, you may start to have symptoms that are not normal. These include: °· Pain with sex (intercourse). °· Bleeding from the vagina. This includes light bleeding (spotting). °· Belly (abdomen) or lower belly cramping or pain. This may be felt on one side. °· A fast heartbeat (pulse). °· Passing out (fainting) after going poop (bowel movement). °If your tube tears, you may have symptoms such as: °· Really bad pain in the belly or lower belly. This happens suddenly. °· Dizziness. °· Passing out. °· Shoulder pain. °GET HELP RIGHT AWAY IF:  °You have any of these symptoms. This is an emergency. °MAKE SURE YOU: °· Understand these instructions. °· Will watch your condition. °· Will get help right away if you are not doing well or get worse. °Document Released: 02/05/2009 Document Revised: 11/14/2013 Document Reviewed: 06/21/2013 °ExitCare® Patient Information ©2015 [employer]. This information is not intended to replace advice given to you by your health care provider. Make sure you discuss any questions you have with your health care provider. ° °

## 2014-07-12 NOTE — MAU Note (Signed)
Here for f/u HCG, day 7 s/p MTX. No pain, some bleeding but has been decreasing.

## 2014-07-12 NOTE — MAU Provider Note (Signed)
History     CSN: [id number]  Arrival date and time: 07/12/14 1942   None     Chief Complaint  Patient presents with  . Labs Only    f/u HCG s/p MTX   HPI This is a 33 y.o. female at [redacted]w[redacted]d who presents for Day 7 HCG followup, s/p Methotrexate.  Denies pain or bleeding.  Patient of Dr Simona Huh.  RN Note:  Here for f/u HCG, day 7 s/p MTX. No pain, some bleeding but has been decreasing.        OB History   Grav Para Term Preterm Abortions TAB SAB Ect Mult Living   4 0 0 0 1 0 1 0 0 0       Past Medical History  Diagnosis Date  . Allergy   . Hypertension   . Generalized headaches   . Asthma   . Ectopic pregnancy Jan 2013  . Urinary tract infection   . Ovarian cyst     Past Surgical History  Procedure Laterality Date  . No past surgeries      Family History  Problem Relation Age of Onset  . Liver disease Father   . Asthma Sister   . Asthma Sister   . Anesthesia problems Other     History  Substance Use Topics  . Smoking status: Current Some Day Smoker -- 3 years  . Smokeless tobacco: Never Used  . Alcohol Use: No    Allergies: No Known Allergies  Prescriptions prior to admission  Medication Sig Dispense Refill  . acetaminophen (TYLENOL) 500 MG tablet Take 1,000 mg by mouth 4 (four) times daily as needed for headache.       . albuterol (PROAIR HFA) 108 (90 BASE) MCG/ACT inhaler Inhale 2 puffs into the lungs every 4 (four) hours as needed for wheezing or shortness of breath.  1 Inhaler  0  . ibuprofen (ADVIL,MOTRIN) 800 MG tablet Take 800 mg by mouth 2 (two) times daily as needed (for menstrual pain.).       Marland Kitchen labetalol (NORMODYNE) 200 MG tablet Take 400 mg by mouth 2 (two) times daily.      . methyldopa (ALDOMET) 500 MG tablet take 1 tablet by mouth twice a day  60 tablet  6  . progesterone (PROMETRIUM) 200 MG capsule Place 200 mg vaginally at bedtime.        Review of Systems  Constitutional: Negative for fever and chills.  Gastrointestinal: Negative  for nausea, vomiting and abdominal pain.  Genitourinary: Negative for dysuria.  Neurological: Negative for dizziness.   Physical Exam   Blood pressure 118/61, pulse 84, temperature 98.8 F (37.1 C), temperature source Oral, resp. rate 18, last menstrual period 06/07/2014, SpO2 100.00%, not currently breastfeeding.  Physical Exam  Constitutional: She is oriented to person, place, and time. She appears well-developed and well-nourished. No distress.  Cardiovascular: Normal rate.   Respiratory: Effort normal.  Genitourinary:  Exam not indicated  Musculoskeletal: Normal range of motion.  Neurological: She is alert and oriented to person, place, and time.  Skin: Skin is warm and dry.  Psychiatric: She has a normal mood and affect.    MAU Course  Procedures  MDM Results for orders placed during the hospital encounter of 07/12/14 (from the past 24 hour(s))  HCG, QUANTITATIVE, PREGNANCY     Status: Abnormal   Collection Time    07/12/14  8:12 PM      Result Value Ref Range   hCG, Beta Chain, Quant, S 2963 (*) <  5 mIU/mL   Results for GRACIANA, SESSA (MRN [id number]) as of 07/13/2014 02:19  Ref. Range 07/04/2014 21:05 07/06/2014 20:30 07/09/2014 20:10  hCG, Beta Chain, Quant, S Latest Range: <5 mIU/mL 3906 (H) 3976 (H) 4038 (H)                                                    Declined MTX    Methotrexate        Day 4  Assessment and Plan  A;  Day 7 s/p Methotrexate       Declining Quant HCG  P:  Discussed with Dr Charlesetta Garibaldi       Call Dr Simona Huh in am to schedule weekly followups Tonita Cong 07/12/2014, 9:18 PM

## 2014-09-24 ENCOUNTER — Encounter (HOSPITAL_COMMUNITY): Payer: Self-pay | Admitting: *Deleted

## 2014-11-01 ENCOUNTER — Encounter (HOSPITAL_BASED_OUTPATIENT_CLINIC_OR_DEPARTMENT_OTHER): Payer: Self-pay | Admitting: *Deleted

## 2014-11-01 NOTE — Progress Notes (Addendum)
NPO AFTER MN. ARRIVE AT [id number]. NEEDS ISTAT, URINE PREG,  AND EKG. WILL TAKE AM MEDS W/ SIPS OF WATER DOS.

## 2014-11-06 MED ORDER — FENTANYL CITRATE 0.05 MG/ML IJ SOLN
INTRAMUSCULAR | Status: AC
  Filled 2014-11-06: qty 4

## 2014-11-06 MED ORDER — MIDAZOLAM HCL 2 MG/2ML IJ SOLN
INTRAMUSCULAR | Status: AC
  Filled 2014-11-06: qty 2

## 2014-11-07 ENCOUNTER — Ambulatory Visit (HOSPITAL_BASED_OUTPATIENT_CLINIC_OR_DEPARTMENT_OTHER): Payer: BC Managed Care – PPO | Admitting: Anesthesiology

## 2014-11-07 ENCOUNTER — Encounter (HOSPITAL_BASED_OUTPATIENT_CLINIC_OR_DEPARTMENT_OTHER)
Admission: RE | Disposition: A | Discharge status: Home / Self Care | Payer: Self-pay | Source: Ambulatory Visit | Attending: Obstetrics and Gynecology

## 2014-11-07 ENCOUNTER — Encounter (HOSPITAL_BASED_OUTPATIENT_CLINIC_OR_DEPARTMENT_OTHER): Payer: Self-pay | Admitting: Anesthesiology

## 2014-11-07 ENCOUNTER — Ambulatory Visit (HOSPITAL_BASED_OUTPATIENT_CLINIC_OR_DEPARTMENT_OTHER)
Admission: RE | Admit: 2014-11-07 | Discharge: 2014-11-07 | Disposition: A | Discharge status: Home / Self Care | Payer: BC Managed Care – PPO | Source: Ambulatory Visit | Attending: Obstetrics and Gynecology | Admitting: Obstetrics and Gynecology

## 2014-11-07 DIAGNOSIS — N736 Female pelvic peritoneal adhesions (postinfective): Secondary | ICD-10-CM | POA: Insufficient documentation

## 2014-11-07 DIAGNOSIS — I10 Essential (primary) hypertension: Secondary | ICD-10-CM | POA: Insufficient documentation

## 2014-11-07 DIAGNOSIS — O001 Tubal pregnancy: Secondary | ICD-10-CM | POA: Diagnosis not present

## 2014-11-07 DIAGNOSIS — F17210 Nicotine dependence, cigarettes, uncomplicated: Secondary | ICD-10-CM | POA: Diagnosis not present

## 2014-11-07 DIAGNOSIS — N7011 Chronic salpingitis: Secondary | ICD-10-CM | POA: Diagnosis present

## 2014-11-07 DIAGNOSIS — J45909 Unspecified asthma, uncomplicated: Secondary | ICD-10-CM | POA: Insufficient documentation

## 2014-11-07 DIAGNOSIS — J309 Allergic rhinitis, unspecified: Secondary | ICD-10-CM | POA: Diagnosis not present

## 2014-11-07 HISTORY — PX: CHROMOPERTUBATION: SHX6288

## 2014-11-07 HISTORY — DX: Chronic salpingitis: N70.11

## 2014-11-07 HISTORY — DX: Allergic rhinitis, unspecified: J30.9

## 2014-11-07 HISTORY — PX: LAPAROSCOPIC LYSIS OF ADHESIONS: SHX5905

## 2014-11-07 HISTORY — DX: Presence of spectacles and contact lenses: Z97.3

## 2014-11-07 HISTORY — DX: Personal history of other complications of pregnancy, childbirth and the puerperium: Z87.59

## 2014-11-07 HISTORY — DX: Female pelvic peritoneal adhesions (postinfective): N73.6

## 2014-11-07 LAB — POCT I-STAT 4, (NA,K, GLUC, HGB,HCT)
GLUCOSE: 80 mg/dL (ref 70–99)
HCT: 40 % (ref 36.0–46.0)
Hemoglobin: 13.6 g/dL (ref 12.0–15.0)
Potassium: 4.3 mEq/L (ref 3.7–5.3)
SODIUM: 139 meq/L (ref 137–147)

## 2014-11-07 LAB — POCT PREGNANCY, URINE: Preg Test, Ur: NEGATIVE

## 2014-11-07 SURGERY — CHROMOPERTUBATION, FALLOPIAN TUBE
Anesthesia: General | Site: Abdomen

## 2014-11-07 MED ORDER — KETOROLAC TROMETHAMINE 30 MG/ML IJ SOLN
INTRAMUSCULAR | Status: DC | PRN
  Administered 2014-11-07: 30 mg via INTRAVENOUS

## 2014-11-07 MED ORDER — BUPIVACAINE-EPINEPHRINE 0.25% -1:200000 IJ SOLN
INTRAMUSCULAR | Status: DC | PRN
  Administered 2014-11-07: 7 mL

## 2014-11-07 MED ORDER — OXYCODONE-ACETAMINOPHEN 7.5-325 MG PO TABS
1.0000 | ORAL_TABLET | ORAL | Status: DC | PRN
Start: 2014-11-07 — End: 2015-05-29

## 2014-11-07 MED ORDER — LACTATED RINGERS IV SOLN
INTRAVENOUS | Status: DC
  Administered 2014-11-07 (×3): via INTRAVENOUS
  Filled 2014-11-07: qty 1000

## 2014-11-07 MED ORDER — ONDANSETRON HCL 4 MG/2ML IJ SOLN
INTRAMUSCULAR | Status: DC | PRN
  Administered 2014-11-07: 4 mg via INTRAVENOUS

## 2014-11-07 MED ORDER — DEXAMETHASONE SODIUM PHOSPHATE 4 MG/ML IJ SOLN
INTRAMUSCULAR | Status: DC | PRN
Start: 2014-11-07 — End: 2014-11-07
  Administered 2014-11-07: 10 mg via INTRAVENOUS

## 2014-11-07 MED ORDER — PROPOFOL 10 MG/ML IV BOLUS
INTRAVENOUS | Status: DC | PRN
  Administered 2014-11-07: 200 mg via INTRAVENOUS

## 2014-11-07 MED ORDER — FENTANYL CITRATE 0.05 MG/ML IJ SOLN
INTRAMUSCULAR | Status: DC | PRN
  Administered 2014-11-07: 50 ug via INTRAVENOUS
  Administered 2014-11-07: 100 ug via INTRAVENOUS

## 2014-11-07 MED ORDER — MIDAZOLAM HCL 2 MG/2ML IJ SOLN
INTRAMUSCULAR | Status: AC
  Filled 2014-11-07: qty 2

## 2014-11-07 MED ORDER — NEOSTIGMINE METHYLSULFATE 10 MG/10ML IV SOLN
INTRAVENOUS | Status: DC | PRN
  Administered 2014-11-07: 3 mg via INTRAVENOUS

## 2014-11-07 MED ORDER — 0.9 % SODIUM CHLORIDE (POUR BTL) OPTIME
TOPICAL | Status: DC | PRN
  Administered 2014-11-07: 1000 mL

## 2014-11-07 MED ORDER — ACETAMINOPHEN 10 MG/ML IV SOLN
INTRAVENOUS | Status: DC | PRN
  Administered 2014-11-07: 1000 mg via INTRAVENOUS

## 2014-11-07 MED ORDER — HYDROMORPHONE HCL 1 MG/ML IJ SOLN
INTRAMUSCULAR | Status: AC
  Filled 2014-11-07: qty 1

## 2014-11-07 MED ORDER — METHYLENE BLUE 1 % INJ SOLN
INTRAMUSCULAR | Status: DC | PRN
  Administered 2014-11-07: 1 mL

## 2014-11-07 MED ORDER — LIDOCAINE HCL (CARDIAC) 20 MG/ML IV SOLN
INTRAVENOUS | Status: DC | PRN
  Administered 2014-11-07: 100 mg via INTRAVENOUS

## 2014-11-07 MED ORDER — OXYCODONE HCL 5 MG/5ML PO SOLN
5.0000 mg | Freq: Once | ORAL | Status: DC | PRN
  Filled 2014-11-07: qty 5

## 2014-11-07 MED ORDER — CEFAZOLIN SODIUM-DEXTROSE 2-3 GM-% IV SOLR
INTRAVENOUS | Status: AC
  Filled 2014-11-07: qty 50

## 2014-11-07 MED ORDER — ROCURONIUM BROMIDE 100 MG/10ML IV SOLN
INTRAVENOUS | Status: DC | PRN
  Administered 2014-11-07: 10 mg via INTRAVENOUS
  Administered 2014-11-07: 30 mg via INTRAVENOUS

## 2014-11-07 MED ORDER — PROMETHAZINE HCL 25 MG/ML IJ SOLN
6.2500 mg | INTRAMUSCULAR | Status: DC | PRN
  Filled 2014-11-07: qty 1

## 2014-11-07 MED ORDER — FENTANYL CITRATE 0.05 MG/ML IJ SOLN
INTRAMUSCULAR | Status: AC
  Filled 2014-11-07: qty 4

## 2014-11-07 MED ORDER — ONDANSETRON HCL 4 MG PO TABS: 4.0000 mg | ORAL_TABLET | Freq: Three times a day (TID) | ORAL | Status: DC | PRN

## 2014-11-07 MED ORDER — HYDROMORPHONE HCL 1 MG/ML IJ SOLN
0.2500 mg | INTRAMUSCULAR | Status: DC | PRN
  Administered 2014-11-07: 0.25 mg via INTRAVENOUS
  Filled 2014-11-07: qty 1

## 2014-11-07 MED ORDER — GLYCOPYRROLATE 0.2 MG/ML IJ SOLN
INTRAMUSCULAR | Status: DC | PRN
  Administered 2014-11-07: 0.6 mg via INTRAVENOUS

## 2014-11-07 MED ORDER — LACTATED RINGERS IR SOLN
Status: DC | PRN
  Administered 2014-11-07: 3000 mL

## 2014-11-07 MED ORDER — CEFAZOLIN SODIUM-DEXTROSE 2-3 GM-% IV SOLR
2.0000 g | INTRAVENOUS | Status: AC
  Administered 2014-11-07: 2 g via INTRAVENOUS
  Filled 2014-11-07: qty 50

## 2014-11-07 MED ORDER — OXYCODONE HCL 5 MG PO TABS
5.0000 mg | ORAL_TABLET | Freq: Once | ORAL | Status: DC | PRN
  Filled 2014-11-07: qty 1

## 2014-11-07 MED ORDER — MIDAZOLAM HCL 5 MG/5ML IJ SOLN
INTRAMUSCULAR | Status: DC | PRN
  Administered 2014-11-07: 2 mg via INTRAVENOUS

## 2014-11-07 MED ORDER — MEPERIDINE HCL 25 MG/ML IJ SOLN
6.2500 mg | INTRAMUSCULAR | Status: DC | PRN
  Filled 2014-11-07: qty 1

## 2014-11-07 SURGICAL SUPPLY — 66 items
APPLICATOR COTTON TIP 6IN NSTR (MISCELLANEOUS) ×4 IMPLANT
BAG URINE DRAINAGE (UROLOGICAL SUPPLIES) ×4 IMPLANT
BANDAGE ADH SHEER 1  50/CT (GAUZE/BANDAGES/DRESSINGS) ×4 IMPLANT
BLADE SURG 11 STRL SS (BLADE) ×4 IMPLANT
CATH FOLEY 2WAY SLVR  5CC 14FR (CATHETERS) ×1
CATH FOLEY 2WAY SLVR 5CC 14FR (CATHETERS) ×3 IMPLANT
CATH ROBINSON RED A/P 16FR (CATHETERS) ×4 IMPLANT
COVER MAYO STAND STRL (DRAPES) ×4 IMPLANT
DERMABOND ADVANCED (GAUZE/BANDAGES/DRESSINGS)
DERMABOND ADVANCED .7 DNX12 (GAUZE/BANDAGES/DRESSINGS) IMPLANT
DEVICE TROCAR PUNCTURE CLOSURE (ENDOMECHANICALS) IMPLANT
DRAPE CAMERA CLOSED 9X96 (DRAPES) IMPLANT
DRAPE UNDERBUTTOCKS STRL (DRAPE) ×4 IMPLANT
DRSG OPSITE POSTOP 3X4 (GAUZE/BANDAGES/DRESSINGS) IMPLANT
ELECT NEEDLE TIP 2.8 STRL (NEEDLE) IMPLANT
ELECT REM PT RETURN 9FT ADLT (ELECTROSURGICAL) ×4
ELECTRODE REM PT RTRN 9FT ADLT (ELECTROSURGICAL) ×3 IMPLANT
EVACUATOR SMOKE 8.L (FILTER) IMPLANT
GLOVE BIO SURGEON STRL SZ8 (GLOVE) ×4 IMPLANT
GLOVE BIOGEL PI IND STRL 7.5 (GLOVE) ×9 IMPLANT
GLOVE BIOGEL PI IND STRL 8.5 (GLOVE) ×3 IMPLANT
GLOVE BIOGEL PI INDICATOR 7.5 (GLOVE) ×3
GLOVE BIOGEL PI INDICATOR 8.5 (GLOVE) ×1
GLOVE SURG SS PI 7.5 STRL IVOR (GLOVE) ×4 IMPLANT
GOWN PREVENTION PLUS LG XLONG (DISPOSABLE) IMPLANT
GOWN STRL REUS W/TWL XL LVL3 (GOWN DISPOSABLE) ×8 IMPLANT
HOLDER FOLEY CATH W/STRAP (MISCELLANEOUS) IMPLANT
IV LACTATED RINGER IRRG 3000ML (IV SOLUTION) ×3
IV LR IRRIG 3000ML ARTHROMATIC (IV SOLUTION) ×3 IMPLANT
MANIPULATOR UTERINE 4.5 ZUMI (MISCELLANEOUS) ×4 IMPLANT
NEEDLE HYPO 25X1 1.5 SAFETY (NEEDLE) ×4 IMPLANT
NEEDLE INSUFFLATION 14GA 120MM (NEEDLE) ×4 IMPLANT
NS IRRIG 500ML POUR BTL (IV SOLUTION) ×4 IMPLANT
PACK BASIN DAY SURGERY FS (CUSTOM PROCEDURE TRAY) ×4 IMPLANT
PACK LAPAROSCOPY II (CUSTOM PROCEDURE TRAY) ×4 IMPLANT
PAD OB MATERNITY 4.3X12.25 (PERSONAL CARE ITEMS) ×4 IMPLANT
PENCIL BUTTON HOLSTER BLD 10FT (ELECTRODE) IMPLANT
POUCH SPECIMEN RETRIEVAL 10MM (ENDOMECHANICALS) IMPLANT
SCALPEL HARMONIC ACE (MISCELLANEOUS) IMPLANT
SEALER TISSUE G2 CVD JAW 35 (ENDOMECHANICALS) IMPLANT
SEALER TISSUE G2 CVD JAW 45CM (ENDOMECHANICALS) IMPLANT
SEPRAFILM MEMBRANE 5X6 (MISCELLANEOUS) IMPLANT
SET IRRIG TUBING LAPAROSCOPIC (IRRIGATION / IRRIGATOR) ×4 IMPLANT
SOLUTION ANTI FOG 6CC (MISCELLANEOUS) ×4 IMPLANT
STRIP CLOSURE SKIN 1/2X4 (GAUZE/BANDAGES/DRESSINGS) ×4 IMPLANT
SUT MNCRL AB 4-0 PS2 18 (SUTURE) ×4 IMPLANT
SUT PROLENE 0 CT 1 30 (SUTURE) IMPLANT
SUT VIC AB 2-0 CT1 27 (SUTURE)
SUT VIC AB 2-0 CT1 TAPERPNT 27 (SUTURE) IMPLANT
SUT VIC AB 2-0 UR6 27 (SUTURE) IMPLANT
SUT VICRYL 0 TIES 12 18 (SUTURE) IMPLANT
SYR 20CC LL (SYRINGE) IMPLANT
SYR 3ML 23GX1 SAFETY (SYRINGE) IMPLANT
SYR 50ML LL SCALE MARK (SYRINGE) ×4 IMPLANT
SYR 5ML LL (SYRINGE) ×4 IMPLANT
SYR CONTROL 10ML LL (SYRINGE) ×4 IMPLANT
SYRINGE 12CC LL (MISCELLANEOUS) ×4 IMPLANT
SYS LAPSCP GELPORT 120MM (MISCELLANEOUS)
SYSTEM LAPSCP GELPORT 120MM (MISCELLANEOUS) IMPLANT
TOWEL OR 17X24 6PK STRL BLUE (TOWEL DISPOSABLE) ×8 IMPLANT
TRAY DSU PREP LF (CUSTOM PROCEDURE TRAY) ×4 IMPLANT
TROCAR OPTI TIP 5M 100M (ENDOMECHANICALS) ×12 IMPLANT
TROCAR XCEL DIL TIP R 11M (ENDOMECHANICALS) IMPLANT
TUBING INSUFFLATION 10FT LAP (TUBING) ×4 IMPLANT
WARMER LAPAROSCOPE (MISCELLANEOUS) ×4 IMPLANT
WATER STERILE IRR 500ML POUR (IV SOLUTION) ×4 IMPLANT

## 2014-11-07 NOTE — Op Note (Signed)
Operative Note  Preoperative diagnosis: Left hydrosalpinx, recurrent tubal pregnancies  Postoperative diagnosis: Left hydrosalpinx, recurrent tubal pregnancies, pelvic adhesions, right tubal distal compromise  Procedure: Laparoscopy, lysis of adhesions, left fimbrioplasty, chromopertubation  Anesthesia: Gen. endotracheal  Complications: None  Estimated blood loss: 100 cc  Specimens: On exam under anesthesia, the uterus was top of normal size, anteverted and mobile. Uterus sounded to 9 cm. There were no adnexal masses. There was no posterior fornix nodularity or induration. On laparoscopy, liver edge diaphragm and gallbladder were within normal limits. There were omental adhesions to the right upper quadrant peritoneum. The appendix was normal. Anterior cul-de-sac was normal. Uterus appears to have small intramural fibroid nodules. The left tube was approximately normal and distally was trapped in some filmy adhesions between the left ovary and the pelvic sidewall. Adhesions covered 30% of the tube. After freeing up the distal end of the tube and freeing up the ovary we could see that there were identifiable fimbria at the distal end of the tube. This was rated as 3 out of 5. The ovary was normal except for 20% coverage with filmy adhesions. The right tube was involved in filmy adhesions a lesser degree. It was pulled into the ovarian fossa because of the adhesions covered 50% of the right ovary some of which had also trapped the distal end of the tube. Upon freeing up all of these adhesions, I noticed fimbria to be at 4 out of 5. There was patency of both of the tubes.  Description of the procedure: The patient was placed in dorsal supine position and general endotracheal anesthesia was given. 2 g of cefazolin were given intravenously for prophylaxis. Patient was placed in lithotomy position. She was prepped and draped inside manner. A Foley catheter was inserted into the bladder. A ZUMI catheter  was placed into the uterine cavity. The surgeon was regloved and a surgical field was created on the abdomen.  After preemptive anesthesia of all surgical sites with 0.5% bupivacaine with 1 200,000 epinephrine, a 5 mm intraumbilical skin incision was made and a Verress needle was inserted. Its correct location was confirmed. A pneumoperitoneum was created with carbon dioxide. A left lower quadrant 5 mm and a right lower quadrant 5 mm incisions were made and ancillary trochars were placed under direct visualization. Above findings were noted.  Using a needle electrode on 77 W of cutting current setting, first the bilateral periovarian filmy adhesions were completely excised and submitted to pathology. The distal end of the left tube could not be better visualized. The peritoneal cysts attached to the distal end were excised and submitted to pathology separately. Filmy fibrous tissue which was occluding the fimbria was carefully excised. The fimbria became more visible now. In addition, I made an anti-mesosalpingeal incision with sharp scissors after scoring the incision with needle tip electrode in order to widen the opening. I applied low wattage coagulating current on the serosa of the distal tube so the desiccation with allowed the fimbria to be everted and remain everted. Next the adhesions around the right tube were carefully excised and fibrous tissue bridging across the fimbria and somewhat narrowing the caliber of the tube distally were excised. Chromotubation showed bilateral tubal patency.  The pelvis was vigorously irrigated and aspirated and hemostasis was checked. The gas was allowed to escape. Instruments were removed. The instrument and lap count were correct. Skin was approximated with 4-0 Monocryl in subcuticular stitches. The patient tolerated the procedure well and was transferred to recovery room  in satisfactory condition.  Governor Specking

## 2014-11-07 NOTE — H&P (Signed)
Shirley Alvarez is a 33 y.o. female , originally referred to me by Dr. Simona Huh, for recurrent pregnancy loss and recurrent ectopic pregnancy. She was diagnosed by ultrasound with a left complex mass which is a probable hydrosalpinx. Recent Chlamydia Antibodies IgG was 1.64 which shows evidence of a past infection. Due to this and her multiple losses, patient was recommended a laparoscopy, lysis of adhesions with possible salpingectomy of left hydrosalpinx.  Pertinent Gynecological History: Menses: normal Bleeding: normal Contraception: none DES exposure: denies Blood transfusions: none Sexually transmitted diseases: Chlamydia  Previous GYN Procedures: no  Last mammogram: n/a Last pap: normal  OB History: G 12 SAB 9 Ectopic 3    Menstrual History: Menarche age: 48 No LMP recorded.    Past Medical History  Diagnosis Date  . Hypertension   . Pelvic adhesions   . Hydrosalpinx     LEFT  . History of ectopic pregnancy     X3   last one 07/ 2015  (received MTX injection)  . Allergic rhinitis   . Wears glasses                     Past Surgical History  Procedure Laterality Date  . Wisdom tooth extraction               Family History  Problem Relation Age of Onset  . Liver disease Father   . Asthma Sister   . Asthma Sister   . Anesthesia problems Other    No hereditary disease.  No cancer of breast, ovary, uterus. No cutaneous leiomyomatosis or renal cell carcinoma.  History   Social History  . Marital Status: Married    Spouse Name: N/A    Number of Children: N/A  . Years of Education: N/A   Occupational History  . Not on file.   Social History Main Topics  . Smoking status: Current Some Day Smoker -- 0.25 packs/day for 10 years    Types: Cigarettes  . Smokeless tobacco: Never Used  . Alcohol Use: No  . Drug Use: No  . Sexual Activity: Yes   Other Topics Concern  . Not on file   Social History Narrative   Early Childhood education   Kindergarten  age group      History of miscarriage December 2011    No Known Allergies  No current facility-administered medications on file prior to encounter.   Current Outpatient Prescriptions on File Prior to Encounter  Medication Sig Dispense Refill  . acetaminophen (TYLENOL) 500 MG tablet Take 1,000 mg by mouth 4 (four) times daily as needed for headache.     . albuterol (PROAIR HFA) 108 (90 BASE) MCG/ACT inhaler Inhale 2 puffs into the lungs every 4 (four) hours as needed for wheezing or shortness of breath. 1 Inhaler 0  . ibuprofen (ADVIL,MOTRIN) 800 MG tablet Take 800 mg by mouth 2 (two) times daily as needed (for menstrual pain.).     Marland Kitchen labetalol (NORMODYNE) 200 MG tablet Take 400 mg by mouth 2 (two) times daily.    . methyldopa (ALDOMET) 500 MG tablet take 1 tablet by mouth twice a day 60 tablet 6     Review of Systems  Constitutional: Negative.   HENT: Negative.   Eyes: Negative.   Respiratory: Negative.   Cardiovascular: Negative.   Gastrointestinal: Negative.   Genitourinary: Negative.   Musculoskeletal: Negative.   Skin: Negative.   Neurological: Negative.   Endo/Heme/Allergies: Negative.   Psychiatric/Behavioral: Negative.  Physical Exam  Ht 5\' 1"  (1.549 m)  Wt 104.327 kg (230 lb)  BMI 43.48 kg/m2  LMP 10/20/2014 (Exact Date)  Breastfeeding? No Constitutional: She is oriented to person, place, and time. She appears well-developed and well-nourished.  HENT:  Head: Normocephalic and atraumatic.  Nose: Nose normal.  Mouth/Throat: Oropharynx is clear and moist. No oropharyngeal exudate.  Eyes: Conjunctivae normal and EOM are normal. Pupils are equal, round, and reactive to light. No scleral icterus.  Neck: Normal range of motion. Neck supple. No tracheal deviation present. No thyromegaly present.  Cardiovascular: Normal rate.   Respiratory: Effort normal and breath sounds normal.  GI: Soft. Bowel sounds are normal. She exhibits no distension and no mass. There is  no tenderness.  Lymphadenopathy:    She has no cervical adenopathy.  Neurological: She is alert and oriented to person, place, and time. She has normal reflexes.  Skin: Skin is warm.  Psychiatric: She has a normal mood and affect. Her behavior is normal. Judgment and thought content normal.       Assessment/Plan:  Laparoscopy, Lysis of Adhesions. Possible L Salpingectomy for probable hydrosalpinx, recurrent pregnancy loss.

## 2014-11-07 NOTE — Transfer of Care (Signed)
Immediate Anesthesia Transfer of Care Note  Patient: Shirley Alvarez  Procedure(s) Performed: Procedure(s): CHROMOPERTUBATION (Bilateral) LAPAROSCOPIC LYSIS OF ADHESIONS AND FIMBRIOPLASTY (N/A)  Patient Location: PACU  Anesthesia Type:General  Level of Consciousness: awake and oriented  Airway & Oxygen Therapy: Patient Spontanous Breathing and Patient connected to face mask oxygen  Post-op Assessment: Report given to PACU RN  Post vital signs: Reviewed and stable  Complications: No apparent anesthesia complications

## 2014-11-07 NOTE — Anesthesia Postprocedure Evaluation (Signed)
Anesthesia Post Note  Patient: Shirley Alvarez  Procedure(s) Performed: Procedure(s) (LRB): CHROMOPERTUBATION (Bilateral) LAPAROSCOPIC LYSIS OF ADHESIONS AND FIMBRIOPLASTY (N/A)  Anesthesia type: General  Patient location: PACU  Post pain: Pain level controlled  Post assessment: Post-op Vital signs reviewed  Last Vitals: BP 160/94 mmHg  Pulse 80  Temp(Src) 36.6 C (Oral)  Resp 18  Ht 5\' 1"  (1.549 m)  Wt 230 lb (104.327 kg)  BMI 43.48 kg/m2  SpO2 98%  LMP 10/20/2014 (Exact Date)  Breastfeeding? No  Post vital signs: Reviewed  Level of consciousness: sedated  Complications: No apparent anesthesia complications

## 2014-11-07 NOTE — Discharge Instructions (Signed)
Lysis of Adhesions Adhesions are internal scars that may lead to troubling side effects. They are bands of scar tissue that can occur anywhere in the body but are usually formed within the abdomen or pelvis. When adhesions are formed, they can cause problems in the normal function of the organs. This scar tissue can cause abnormal "sticking" together of two surfaces. This kind of scar can also "squeeze" an organ and cause a blockage. Adhesions can develop after surgery and may also form as a result of an inflammation or infection.   Lysis (loosening or setting free) is a surgical procedure done to remove or loosen the scars that cause the problems described above. This can help to reduce or relieve pain and help to restore normal function.    . AFTER THE PROCEDURE  The surgery may last for 1-3 hours. Depending on the type of surgery, you may have to spend a night or a few days in the hospital. Typically, patients stay in the hospital and can not eat or drink until bowel function returns. Patients may have a tube put in place to decrease problems with the bowels (nasogastric tube). When your system can tolerate food, you can go home. Your caregiver will give you certain medications to control your pain.     HOME CARE INSTRUCTIONS     Follow all the instructions given by your surgeon at the time of discharge.  Take all prescribed medications as instructed and go for regular follow-ups.  Keep the dressing clean and dry.  As pain gets less, increase moving and walking and other regular activities that you do every day.  Avoid lifting heavy objects unless advised otherwise by your caregiver.  At first, you may need to eat only a little at a time. As your appetite improves, you can slowly go back to eating normally.  If applicable, your caregiver can help you to know when it is okay to start driving and exercising again. SEEK MEDICAL CARE IF:   You develop fever above 100 F (37.8  C).  You develop infection.  Your pain increases.  You notice redness or swelling at the site of the incision.  You have nausea and vomiting that does not go away after a few hours.  You have a cough.  You have bleeding or oozing from the incision site. SEEK IMMEDIATE MEDICAL CARE IF:   You develop fever of 102 F (38.9 C) or above.  You develop severe abdominal, chest pain.  You develop shortness of breath.  You experience nausea and/or vomiting that keeps coming back or is severe. Document Released: 02/05/2009 Document Revised: 02/01/2012 Document Reviewed: 02/05/2009 [facility] Patient Information 2015 [place], [place]. This information is not intended to replace advice given to you by your health care provider. Make sure you discuss any questions you have with your health care provider.     Diagnostic Laparoscopy Laparoscopy is a surgical procedure. It is used to diagnose and treat diseases inside the belly (abdomen). It is usually a brief, common, and relatively simple procedure. The laparoscopeis a thin, lighted, pencil-sized instrument. It is like a telescope. It is inserted into your abdomen through a small cut (incision). Your caregiver can look at the organs inside your body through this instrument. He or she can see if there is anything abnormal. Laparoscopy can be done either in a hospital or outpatient clinic. You may be given a mild sedative to help you relax before the procedure. Once in the operating room, you will be  given a drug to make you sleep (general anesthesia). Laparoscopy usually lasts less than 1 hour. After the procedure, you will be monitored in a recovery area until you are stable and doing well. Once you are home, it will take 2 to 3 days to fully recover.  AFTER THE PROCEDURE   The gas is released from inside the abdomen.  The incisions are closed with stitches (sutures). Because these incisions are small (usually less than 1/2 inch), there is usually  minimal discomfort after the procedure. There may be some mild discomfort in the throat. This is from the tube placed in the throat while you were sleeping. You may have some mild abdominal discomfort. There may also be discomfort from the instrument placement incisions in the abdomen.  The recovery time is shortened as long as there are no complications.  You will rest in a recovery room until stable and doing well. As long as there are no complications, you may be allowed to go home. FINDING OUT THE RESULTS OF YOUR TEST Not all test results are available during your visit. If your test results are not back during the visit, make an appointment with your caregiver to find out the results. Do not assume everything is normal if you have not heard from your caregiver or the medical facility. It is important for you to follow up on all of your test results. HOME CARE INSTRUCTIONS   Take all medicines as directed.  Only take over-the-counter or prescription medicines for pain, discomfort, or fever as directed by your caregiver.  Resume daily activities as directed.  Showers are preferred over baths.  You may resume sexual activities in 1 week or as directed.  Do not drive while taking narcotics. SEEK MEDICAL CARE IF:   There is increasing abdominal pain.  There is new pain in the shoulders (shoulder strap areas).  You feel lightheaded or faint.  You have the chills.  You or your child has an oral temperature above 102 F (38.9 C).  There is pus-like (purulent) drainage from any of the wounds.  You are unable to pass gas or have a bowel movement.  You feel sick to your stomach (nauseous) or throw up (vomit). MAKE SURE YOU:   Understand these instructions.  Will watch your condition.  Will get help right away if you are not doing well or get worse. Document Released: 02/15/2001 Document Revised: 03/06/2013 Document Reviewed: 11/09/2007 [facility] Patient Information 2015  [place], [place]. This information is not intended to replace advice given to you by your health care provider. Make sure you discuss any questions you have with your health care provider.    Post Anesthesia Home Care Instructions  Activity: Get plenty of rest for the remainder of the day. A responsible adult should stay with you for 24 hours following the procedure.  For the next 24 hours, DO NOT: -Drive a car -Paediatric nurse -Drink alcoholic beverages -Take any medication unless instructed by your physician -Make any legal decisions or sign important papers.  Meals: Start with liquid foods such as gelatin or soup. Progress to regular foods as tolerated. Avoid greasy, spicy, heavy foods. If nausea and/or vomiting occur, drink only clear liquids until the nausea and/or vomiting subsides. Call your physician if vomiting continues.  Special Instructions/Symptoms: Your throat may feel dry or sore from the anesthesia or the breathing tube placed in your throat during surgery. If this causes discomfort, gargle with warm salt water. The discomfort should disappear within 24 hours.

## 2014-11-07 NOTE — Anesthesia Preprocedure Evaluation (Signed)
Anesthesia Evaluation  Patient identified by MRN, date of birth, ID band Patient awake    Reviewed: Allergy & Precautions, H&P , NPO status , Patient's Chart, lab work & pertinent test results  Airway Mallampati: II  TM Distance: >3 FB Neck ROM: Full    Dental no notable dental hx.    Pulmonary asthma , Current Smoker,  breath sounds clear to auscultation  Pulmonary exam normal       Cardiovascular hypertension, Pt. on medications Rhythm:Regular Rate:Normal     Neuro/Psych  Headaches, negative psych ROS   GI/Hepatic negative GI ROS, Neg liver ROS,   Endo/Other  negative endocrine ROSMorbid obesity  Renal/GU negative Renal ROS     Musculoskeletal negative musculoskeletal ROS (+)   Abdominal   Peds  Hematology negative hematology ROS (+)   Anesthesia Other Findings   Reproductive/Obstetrics negative OB ROS                             Anesthesia Physical Anesthesia Plan  ASA: III  Anesthesia Plan: General   Post-op Pain Management:    Induction: Intravenous  Airway Management Planned: Oral ETT  Additional Equipment: None  Intra-op Plan:   Post-operative Plan: Extubation in OR  Informed Consent: I have reviewed the patients History and Physical, chart, labs and discussed the procedure including the risks, benefits and alternatives for the proposed anesthesia with the patient or authorized representative who has indicated his/her understanding and acceptance.   Dental advisory given  Plan Discussed with: CRNA  Anesthesia Plan Comments:         Anesthesia Quick Evaluation

## 2014-11-07 NOTE — Anesthesia Procedure Notes (Signed)
Procedure Name: Intubation Date/Time: 11/07/2014 10:31 AM Performed by: Bethena Roys T Pre-anesthesia Checklist: Patient identified, Emergency Drugs available, Suction available and Patient being monitored Patient Re-evaluated:Patient Re-evaluated prior to inductionOxygen Delivery Method: Circle System Utilized Preoxygenation: Pre-oxygenation with 100% oxygen Intubation Type: IV induction Ventilation: Mask ventilation without difficulty Laryngoscope Size: Mac and 3 Grade View: Grade I Tube type: Oral Tube size: 7.0 mm Number of attempts: 1 Airway Equipment and Method: stylet and oral airway Placement Confirmation: ETT inserted through vocal cords under direct vision,  positive ETCO2 and breath sounds checked- equal and bilateral Secured at: 21 cm Tube secured with: Tape Dental Injury: Teeth and Oropharynx as per pre-operative assessment

## 2014-11-08 ENCOUNTER — Encounter (HOSPITAL_BASED_OUTPATIENT_CLINIC_OR_DEPARTMENT_OTHER): Payer: Self-pay | Admitting: Obstetrics and Gynecology

## 2014-11-08 LAB — POCT I-STAT 4, (NA,K, GLUC, HGB,HCT)
Glucose, Bld: 78 mg/dL (ref 70–99)
HCT: 39 % (ref 36.0–46.0)
HEMOGLOBIN: 13.3 g/dL (ref 12.0–15.0)
Potassium: 8.1 mEq/L (ref 3.7–5.3)
Sodium: 135 mEq/L — ABNORMAL LOW (ref 137–147)

## 2014-12-11 ENCOUNTER — Other Ambulatory Visit: Payer: Self-pay | Admitting: Internal Medicine

## 2014-12-12 NOTE — Telephone Encounter (Signed)
Ok to refill x 90 days  Need Ov before runs out .

## 2014-12-12 NOTE — Telephone Encounter (Signed)
Pt currently pregnant.  Will send to [facility] for review.

## 2014-12-13 NOTE — Telephone Encounter (Signed)
Sent to the pharmacy by e-scribe.  Will send a letter to the pt as a reminder to make an appt.

## 2015-02-18 ENCOUNTER — Telehealth: Payer: Self-pay | Admitting: Internal Medicine

## 2015-02-18 NOTE — Telephone Encounter (Addendum)
Pt request refill labetalol (NORMODYNE) 200 MG tablet methyldopa (ALDOMET) 500 MG tablet cetirizine (ZYRTEC) 10 MG tablet  Pt has made appt for CPE on 05/29/15.  Pt is off work that week. Can she get enough to get her through or do we need work in sooner?  [facility]/ [address]

## 2015-02-18 NOTE — Telephone Encounter (Signed)
Do not see where [facility] has filled labetalol in the past.  Will send for authorization.

## 2015-02-18 NOTE — Telephone Encounter (Signed)
Ok to refill through July ( labs done in hospital)  BP Readings from Last 3 Encounters:  11/07/14 153/83  07/12/14 118/61  07/09/14 125/84  please  Document  Recent  BP readings

## 2015-02-19 ENCOUNTER — Telehealth: Payer: Self-pay | Admitting: Family Medicine

## 2015-02-19 MED ORDER — LABETALOL HCL 200 MG PO TABS: 400.0000 mg | ORAL_TABLET | Freq: Two times a day (BID) | ORAL | Status: DC

## 2015-02-19 MED ORDER — CETIRIZINE HCL 10 MG PO TABS: 10.0000 mg | ORAL_TABLET | Freq: Every day | ORAL | Status: DC

## 2015-02-19 MED ORDER — METHYLDOPA 500 MG PO TABS: 500.0000 mg | ORAL_TABLET | Freq: Two times a day (BID) | ORAL | Status: DC

## 2015-02-19 NOTE — Telephone Encounter (Signed)
Sent to the pharmacy by e-scribe. 

## 2015-02-19 NOTE — Telephone Encounter (Signed)
Request to resend refill for Labetalol 200 mg take 2 po bid and send to [facility].

## 2015-05-21 ENCOUNTER — Other Ambulatory Visit: Payer: Self-pay | Admitting: *Deleted

## 2015-05-21 DIAGNOSIS — Z0000 Encounter for general adult medical examination without abnormal findings: Secondary | ICD-10-CM

## 2015-05-28 ENCOUNTER — Other Ambulatory Visit (INDEPENDENT_AMBULATORY_CARE_PROVIDER_SITE_OTHER): Payer: BLUE CROSS/BLUE SHIELD

## 2015-05-28 DIAGNOSIS — Z0000 Encounter for general adult medical examination without abnormal findings: Secondary | ICD-10-CM | POA: Diagnosis not present

## 2015-05-28 LAB — COMPREHENSIVE METABOLIC PANEL
ALK PHOS: 67 U/L (ref 39–117)
ALT: 15 U/L (ref 0–35)
AST: 14 U/L (ref 0–37)
Albumin: 4 g/dL (ref 3.5–5.2)
BUN: 10 mg/dL (ref 6–23)
CO2: 24 mEq/L (ref 19–32)
CREATININE: 0.86 mg/dL (ref 0.40–1.20)
Calcium: 9.4 mg/dL (ref 8.4–10.5)
Chloride: 106 mEq/L (ref 96–112)
GFR: 97.31 mL/min (ref >60.00)
Glucose, Bld: 86 mg/dL (ref 70–99)
Potassium: 3.9 mEq/L (ref 3.5–5.1)
Sodium: 138 mEq/L (ref 135–145)
Total Bilirubin: 0.3 mg/dL (ref 0.2–1.2)
Total Protein: 7 g/dL (ref 6.0–8.3)

## 2015-05-28 LAB — CBC WITH DIFFERENTIAL/PLATELET
BASOS PCT: 0.4 % (ref 0.0–3.0)
Basophils Absolute: 0 10*3/uL (ref 0.0–0.1)
EOS ABS: 0.1 10*3/uL (ref 0.0–0.7)
Eosinophils Relative: 1.5 % (ref 0.0–5.0)
HCT: 36.6 % (ref 36.0–46.0)
Hemoglobin: 12.3 g/dL (ref 12.0–15.0)
Lymphocytes Relative: 23.2 % (ref 12.0–46.0)
Lymphs Abs: 1.9 10*3/uL (ref 0.7–4.0)
MCHC: 33.6 g/dL (ref 30.0–36.0)
MCV: 81.8 fl (ref 78.0–100.0)
MONO ABS: 0.6 10*3/uL (ref 0.1–1.0)
Monocytes Relative: 7.3 % (ref 3.0–12.0)
NEUTROS PCT: 67.6 % (ref 43.0–77.0)
Neutro Abs: 5.4 10*3/uL (ref 1.4–7.7)
Platelets: 364 10*3/uL (ref 150.0–400.0)
RBC: 4.47 Mil/uL (ref 3.87–5.11)
RDW: 15 % (ref 11.5–15.5)
WBC: 8 10*3/uL (ref 4.0–10.5)

## 2015-05-28 LAB — LIPID PANEL
Cholesterol: 149 mg/dL (ref 0–200)
HDL: 38.7 mg/dL — ABNORMAL LOW (ref >39.00)
LDL CALC: 105 mg/dL — AB (ref 0–99)
NONHDL: 110.3
TRIGLYCERIDES: 28 mg/dL (ref 0.0–149.0)
Total CHOL/HDL Ratio: 4
VLDL: 5.6 mg/dL (ref 0.0–40.0)

## 2015-05-28 LAB — TSH: TSH: 1.71 u[IU]/mL (ref 0.35–4.50)

## 2015-05-29 ENCOUNTER — Encounter: Payer: Self-pay | Admitting: Internal Medicine

## 2015-05-29 ENCOUNTER — Ambulatory Visit (INDEPENDENT_AMBULATORY_CARE_PROVIDER_SITE_OTHER): Payer: BLUE CROSS/BLUE SHIELD | Admitting: Internal Medicine

## 2015-05-29 VITALS — BP 146/98 | Temp 98.5°F | Ht 60.5 in | Wt 228.7 lb

## 2015-05-29 DIAGNOSIS — Z0000 Encounter for general adult medical examination without abnormal findings: Secondary | ICD-10-CM | POA: Diagnosis not present

## 2015-05-29 DIAGNOSIS — L600 Ingrowing nail: Secondary | ICD-10-CM

## 2015-05-29 DIAGNOSIS — Z720 Tobacco use: Secondary | ICD-10-CM | POA: Diagnosis not present

## 2015-05-29 DIAGNOSIS — I10 Essential (primary) hypertension: Secondary | ICD-10-CM

## 2015-05-29 DIAGNOSIS — F17200 Nicotine dependence, unspecified, uncomplicated: Secondary | ICD-10-CM

## 2015-05-29 DIAGNOSIS — Z23 Encounter for immunization: Secondary | ICD-10-CM

## 2015-05-29 MED ORDER — LABETALOL HCL 200 MG PO TABS: 600.0000 mg | ORAL_TABLET | Freq: Two times a day (BID) | ORAL | Status: DC

## 2015-05-29 NOTE — Addendum Note (Signed)
Addended byShanon Ace K on: 05/29/2015 05:17 PM   Modules accepted: Orders

## 2015-05-29 NOTE — Patient Instructions (Addendum)
Stop tobacco. Chantix is a pregnancy category C   . You can discuss with  You ob and we can send in this med   Increase the labetolol to 600 mg twice a day     ROV in about  A month . Will adjust dose as approprioate at that time.   Healthy lifestyle includes : At least 150 minutes of exercise weeks  , weight at healthy levels, which is usually   BMI 19-25. Avoid trans fats and processed foods;  Increase fresh fruits and veges to 5 servings per day. And avoid sweet beverages including tea and juice. Mediterranean diet with olive oil and nuts have been noted to be heart and brain healthy . Avoid tobacco products . Limit  alcohol to  7 per week for women and 14 servings for men.  Get adequate sleep . Wear seat belts . Don't text and drive .   Varenicline oral tablets What is this medicine? VARENICLINE (var EN i kleen) is used to help people quit smoking. It can reduce the symptoms caused by stopping smoking. It is used with a patient support program recommended by your physician. This medicine may be used for other purposes; ask your health care provider or pharmacist if you have questions. COMMON BRAND NAME(S): Chantix What should I tell my health care provider before I take this medicine? They need to know if you have any of these conditions: -bipolar disorder, depression, schizophrenia or other mental illness -heart disease -if you often drink alcohol -kidney disease -peripheral vascular disease -seizures -stroke -suicidal thoughts, plans, or attempt; a previous suicide attempt by you or a family member -an unusual or allergic reaction to varenicline, other medicines, foods, dyes, or preservatives -pregnant or trying to get pregnant -breast-feeding How should I use this medicine? You should set a date to stop smoking and tell your doctor. Start this medicine one week before the quit date. You can also start taking this medicine before you choose a quit date, and then pick a quit  date that is between 8 and 35 days of treatment with this medicine. Stick to your plan; ask about support groups or other ways to help you remain a 'quitter'. Take this medicine by mouth after eating. Take with a full glass of water. Follow the directions on the prescription label. Take your doses at regular intervals. Do not take your medicine more often than directed. A special MedGuide will be given to you by the pharmacist with each prescription and refill. Be sure to read this information carefully each time. Talk to your pediatrician regarding the use of this medicine in children. This medicine is not approved for use in children. Overdosage: If you think you have taken too much of this medicine contact a poison control center or emergency room at once. NOTE: This medicine is only for you. Do not share this medicine with others. What if I miss a dose? If you miss a dose, take it as soon as you can. If it is almost time for your next dose, take only that dose. Do not take double or extra doses. What may interact with this medicine? -alcohol or any product that contains alcohol -insulin -other stop smoking aids -theophylline -warfarin This list may not describe all possible interactions. Give your health care provider a list of all the medicines, herbs, non-prescription drugs, or dietary supplements you use. Also tell them if you smoke, drink alcohol, or use illegal drugs. Some items may interact with your medicine.  What should I watch for while using this medicine? Visit your doctor or health care professional for regular check ups. Ask for ongoing advice and encouragement from your doctor or healthcare professional, friends, and family to help you quit. If you smoke while on this medication, quit again Your mouth may get dry. Chewing sugarless gum or sucking hard candy, and drinking plenty of water may help. Contact your doctor if the problem does not go away or is severe. You may get drowsy  or dizzy. Do not drive, use machinery, or do anything that needs mental alertness until you know how this medicine affects you. Do not stand or sit up quickly, especially if you are an older patient. This reduces the risk of dizzy or fainting spells. The use of this medicine may increase the chance of suicidal thoughts or actions. Pay special attention to how you are responding while on this medicine. Any worsening of mood, or thoughts of suicide or dying should be reported to your health care professional right away. What side effects may I notice from receiving this medicine? Side effects that you should report to your doctor or health care professional as soon as possible: -allergic reactions like skin rash, itching or hives, swelling of the face, lips, tongue, or throat -breathing problems -changes in vision -chest pain or chest tightness -confusion, trouble speaking or understanding -fast, irregular heartbeat -feeling faint or lightheaded, falls -fever -pain in legs when walking -problems with balance, talking, walking -redness, blistering, peeling or loosening of the skin, including inside the mouth -ringing in ears -seizures -sudden numbness or weakness of the face, arm or leg -suicidal thoughts or other mood changes -trouble passing urine or change in the amount of urine -unusual bleeding or bruising -unusually weak or tired Side effects that usually do not require medical attention (report to your doctor or health care professional if they continue or are bothersome): -constipation -headache -nausea, vomiting -strange dreams -stomach gas -trouble sleeping This list may not describe all possible side effects. Call your doctor for medical advice about side effects. You may report side effects to FDA at 1-800-FDA-1088. Where should I keep my medicine? Keep out of the reach of children. Store at room temperature between 15 and 30 degrees C (59 and 86 degrees F). Throw away any  unused medicine after the expiration date. NOTE: This sheet is a summary. It may not cover all possible information. If you have questions about this medicine, talk to your doctor, pharmacist, or health care provider.  2015, Elsevier/Gold Standard. (2013-08-21 13:37:47)

## 2015-05-29 NOTE — Progress Notes (Signed)
Pre visit review using our clinic review tool, if applicable. No additional management support is needed unless otherwise documented below in the visit note.  Chief Complaint  Patient presents with  . Annual Exam    HPI: Patient  Shirley Alvarez  34 y.o. comes in today for Preventive Health Care visit here with husband. Since last year had ectopic preg and tubal surery lysis of adhesions still attempting successful pregnancy. She doesn't think the labetalol is working very well no significant side effect otherwise   Asthma breathing says it's okay still smoking half a pack per day off and on trying to quit sleep schedule is erratic as of her job. Has chronic deformed which she calls ingrown toenail on the right as [place] referral. Wears flats his work no other trauma. No drainage.  Health Maintenance  Topic Date Due  . HIV Screening  05/23/2016 (Originally [birth year]10[birth year]1996)  . INFLUENZA VACCINE  06/24/2015  . PAP SMEAR  12/18/2016  . TETANUS/TDAP  05/28/2025   Health Maintenance Review LIFESTYLE:  Exercise:   no Tobacco/ETS:  1/2 ppd  On and off . Alcohol:  no Sugar beverages: tea once a day.  Sleep:   24 and 8  Drug use: no PAP: had surgery in December .    ROS:  GEN/ HEENT: No fever, significant weight changes sweats headaches vision problems hearing changes, CV/ PULM; No chest pain shortness of breath cough, syncope,edema  change in exercise tolerance. GI /GU: No adominal pain, vomiting, change in bowel habits. No blood in the stool. No significant GU symptoms. SKIN/HEME: ,no acute skin rashes suspicious lesions or bleeding. No lymphadenopathy, nodules, masses.  NEURO/ PSYCH:  No neurologic signs such as weakness numbness. No depression anxiety. IMM/ Allergy: No unusual infections.  Allergy .   REST of 12 system review negative except as per HPI   Past Medical History  Diagnosis Date  . Hypertension   . Pelvic adhesions   . Hydrosalpinx     LEFT  . History of  ectopic pregnancy     X3   last one 07/ 2015  (received MTX injection)  . Allergic rhinitis   . Wears glasses     Past Surgical History  Procedure Laterality Date  . Wisdom tooth extraction    . Chromopertubation Bilateral 11/07/2014    Procedure: CHROMOPERTUBATION;  Surgeon: Governor Specking, MD;  Location: [facility];  Service: Gynecology;  Laterality: Bilateral;  . Laparoscopic lysis of adhesions N/A 11/07/2014    Procedure: LAPAROSCOPIC LYSIS OF ADHESIONS AND FIMBRIOPLASTY;  Surgeon: Governor Specking, MD;  Location: [facility];  Service: Gynecology;  Laterality: N/A;    Family History  Problem Relation Age of Onset  . Liver disease Father   . Asthma Sister   . Asthma Sister   . Anesthesia problems Other     History   Social History  . Marital Status: Married    Spouse Name: N/A  . Number of Children: N/A  . Years of Education: N/A   Social History Main Topics  . Smoking status: Current Some Day Smoker -- 0.25 packs/day for 10 years    Types: Cigarettes  . Smokeless tobacco: Never Used  . Alcohol Use: No  . Drug Use: No  . Sexual Activity: Yes   Other Topics Concern  . None   Social History Narrative   Early Childhood education   Kindergarten age group      History of miscarriage December 2011    Outpatient Prescriptions  Prior to Visit  Medication Sig Dispense Refill  . acetaminophen (TYLENOL) 500 MG tablet Take 1,000 mg by mouth 4 (four) times daily as needed for headache.     . albuterol (PROAIR HFA) 108 (90 BASE) MCG/ACT inhaler Inhale 2 puffs into the lungs every 4 (four) hours as needed for wheezing or shortness of breath. 1 Inhaler 0  . cetirizine (ZYRTEC) 10 MG tablet Take 1 tablet (10 mg total) by mouth daily. 30 tablet 3  . fluticasone (FLONASE) 50 MCG/ACT nasal spray Place 2 sprays into both nostrils daily as needed for allergies.    . fluticasone (FLONASE) 50 MCG/ACT nasal spray instill 2 sprays into each nostril  once daily 16 g 2  . ibuprofen (ADVIL,MOTRIN) 800 MG tablet Take 800 mg by mouth 2 (two) times daily as needed (for menstrual pain.).     Marland Kitchen methyldopa (ALDOMET) 500 MG tablet Take 1 tablet (500 mg total) by mouth 2 (two) times daily. 60 tablet 3  . labetalol (NORMODYNE) 200 MG tablet Take 2 tablets (400 mg total) by mouth 2 (two) times daily. 120 tablet 3  . ondansetron (ZOFRAN) 4 MG tablet Take 1 tablet (4 mg total) by mouth every 8 (eight) hours as needed for nausea or vomiting. 12 tablet 0  . oxyCODONE-acetaminophen (PERCOCET) 7.5-325 MG per tablet Take 1 tablet by mouth every 4 (four) hours as needed for pain. 30 tablet 0   No facility-administered medications prior to visit.     EXAM:  BP 150/100 mmHg  Temp(Src) 98.5 F (36.9 C) (Oral)  Ht 5' 0.5" (1.537 m)  Wt 228 lb 11.2 oz (103.738 kg)  BMI 43.91 kg/m2  LMP 05/27/2015  Body mass index is 43.91 kg/(m^2).  Physical Exam: Vital signs reviewed faint odor of tobacco here with husband. MEQ:ASTM is a well-developed well-nourished alert cooperative    who appearsr stated age in no acute distress.  HEENT: normocephalic atraumatic , Eyes: PERRL EOM's full, conjunctiva clear, Nares: paten,t no deformity discharge or tenderness., Ears: no deformity EAC's clear TMs with normal landmarks. Mouth: clear OP, no lesions, edema.  Moist mucous membranes. Dentition in adequate repair. NECK: supple without masses, thyromegaly or bruits. CHEST/PULM:  Clear to auscultation and percussion breath sounds equal no wheeze , rales or rhonchi. No chest wall deformities or tenderness. CV: PMI is nondisplaced, S1 S2 no gallops, murmurs, rubs. Peripheral pulses are full without delay.No JVD .  ABDOMEN: Bowel sounds normal nontender  No guard or rebound, no hepato splenomegal no CVA tenderness.  No hernia. Extremtities:  No clubbing cyanosis or edema, no acute joint swelling or redness no focal atrophy NEURO:  Oriented x3, cranial nerves 3-12 appear to be  intact, no obvious focal weakness,gait within normal limits no abnormal reflexes or asymmetrical SKIN: No acute rashes normal turgor, color, no bruising or petechiae. Right great toenail painted curved in somewhat deformed no weeping or infection some mild tenderness. PSYCH: Oriented, good eye contact, no obvious depression anxiety, cognition and judgment appear normal. LN: no cervical axillary inguinal adenopathy  Lab Results  Component Value Date   WBC 8.0 05/28/2015   HGB 12.3 05/28/2015   HCT 36.6 05/28/2015   PLT 364.0 05/28/2015   GLUCOSE 86 05/28/2015   CHOL 149 05/28/2015   TRIG 28.0 05/28/2015   HDL 38.70* 05/28/2015   LDLCALC 105* 05/28/2015   ALT 15 05/28/2015   AST 14 05/28/2015   NA 138 05/28/2015   K 3.9 05/28/2015   CL 106 05/28/2015   CREATININE 0.86  05/28/2015   BUN 10 05/28/2015   CO2 24 05/28/2015   TSH 1.71 05/28/2015   HGBA1C 5.4 06/23/2013   BP Readings from Last 3 Encounters:  05/29/15 150/100  11/07/14 153/83  07/12/14 118/61   Wt Readings from Last 3 Encounters:  05/29/15 228 lb 11.2 oz (103.738 kg)  11/07/14 230 lb (104.327 kg)  07/09/14 234 lb 4 oz (106.255 kg)    ASSESSMENT AND PLAN:  Discussed the following assessment and plan:  Visit for preventive health examination  Essential hypertension - The pressure rechecked 148/90 increase labetalol 600 twice a day follow-up in a month encouraged to stop tobacco  Tobacco use disorder - Discussion can talk with OB about Chantix and pregnancy category C.  Need for tetanus booster - Plan: Td vaccine greater than or equal to 7yo preservative free IM  Patient Care Team: Burnis Medin, MD as PCP - General Marylynn Pearson, MD (Obstetrics and Gynecology) Patient Instructions  Stop tobacco. Chantix is a pregnancy category C   . You can discuss with  You ob and we can send in this med   Increase the labetolol to 600 mg twice a day     ROV in about  A month . Will adjust dose as approprioate at that  time.   Healthy lifestyle includes : At least 150 minutes of exercise weeks  , weight at healthy levels, which is usually   BMI 19-25. Avoid trans fats and processed foods;  Increase fresh fruits and veges to 5 servings per day. And avoid sweet beverages including tea and juice. Mediterranean diet with olive oil and nuts have been noted to be heart and brain healthy . Avoid tobacco products . Limit  alcohol to  7 per week for women and 14 servings for men.  Get adequate sleep . Wear seat belts . Don't text and drive .   Varenicline oral tablets What is this medicine? VARENICLINE (var EN i kleen) is used to help people quit smoking. It can reduce the symptoms caused by stopping smoking. It is used with a patient support program recommended by your physician. This medicine may be used for other purposes; ask your health care provider or pharmacist if you have questions. COMMON BRAND NAME(S): Chantix What should I tell my health care provider before I take this medicine? They need to know if you have any of these conditions: -bipolar disorder, depression, schizophrenia or other mental illness -heart disease -if you often drink alcohol -kidney disease -peripheral vascular disease -seizures -stroke -suicidal thoughts, plans, or attempt; a previous suicide attempt by you or a family member -an unusual or allergic reaction to varenicline, other medicines, foods, dyes, or preservatives -pregnant or trying to get pregnant -breast-feeding How should I use this medicine? You should set a date to stop smoking and tell your doctor. Start this medicine one week before the quit date. You can also start taking this medicine before you choose a quit date, and then pick a quit date that is between 8 and 35 days of treatment with this medicine. Stick to your plan; ask about support groups or other ways to help you remain a 'quitter'. Take this medicine by mouth after eating. Take with a full glass of  water. Follow the directions on the prescription label. Take your doses at regular intervals. Do not take your medicine more often than directed. A special MedGuide will be given to you by the pharmacist with each prescription and refill. Be sure to read this  information carefully each time. Talk to your pediatrician regarding the use of this medicine in children. This medicine is not approved for use in children. Overdosage: If you think you have taken too much of this medicine contact a poison control center or emergency room at once. NOTE: This medicine is only for you. Do not share this medicine with others. What if I miss a dose? If you miss a dose, take it as soon as you can. If it is almost time for your next dose, take only that dose. Do not take double or extra doses. What may interact with this medicine? -alcohol or any product that contains alcohol -insulin -other stop smoking aids -theophylline -warfarin This list may not describe all possible interactions. Give your health care provider a list of all the medicines, herbs, non-prescription drugs, or dietary supplements you use. Also tell them if you smoke, drink alcohol, or use illegal drugs. Some items may interact with your medicine. What should I watch for while using this medicine? Visit your doctor or health care professional for regular check ups. Ask for ongoing advice and encouragement from your doctor or healthcare professional, friends, and family to help you quit. If you smoke while on this medication, quit again Your mouth may get dry. Chewing sugarless gum or sucking hard candy, and drinking plenty of water may help. Contact your doctor if the problem does not go away or is severe. You may get drowsy or dizzy. Do not drive, use machinery, or do anything that needs mental alertness until you know how this medicine affects you. Do not stand or sit up quickly, especially if you are an older patient. This reduces the risk of  dizzy or fainting spells. The use of this medicine may increase the chance of suicidal thoughts or actions. Pay special attention to how you are responding while on this medicine. Any worsening of mood, or thoughts of suicide or dying should be reported to your health care professional right away. What side effects may I notice from receiving this medicine? Side effects that you should report to your doctor or health care professional as soon as possible: -allergic reactions like skin rash, itching or hives, swelling of the face, lips, tongue, or throat -breathing problems -changes in vision -chest pain or chest tightness -confusion, trouble speaking or understanding -fast, irregular heartbeat -feeling faint or lightheaded, falls -fever -pain in legs when walking -problems with balance, talking, walking -redness, blistering, peeling or loosening of the skin, including inside the mouth -ringing in ears -seizures -sudden numbness or weakness of the face, arm or leg -suicidal thoughts or other mood changes -trouble passing urine or change in the amount of urine -unusual bleeding or bruising -unusually weak or tired Side effects that usually do not require medical attention (report to your doctor or health care professional if they continue or are bothersome): -constipation -headache -nausea, vomiting -strange dreams -stomach gas -trouble sleeping This list may not describe all possible side effects. Call your doctor for medical advice about side effects. You may report side effects to FDA at 1-800-FDA-1088. Where should I keep my medicine? Keep out of the reach of children. Store at room temperature between 15 and 30 degrees C (59 and 86 degrees F). Throw away any unused medicine after the expiration date. NOTE: This sheet is a summary. It may not cover all possible information. If you have questions about this medicine, talk to your doctor, pharmacist, or health care provider.  2015,  Elsevier/Gold Standard. (2013-08-21  13:37:47)     Standley Brooking. Cheril Slattery M.D.

## 2015-07-10 ENCOUNTER — Telehealth: Payer: Self-pay | Admitting: Internal Medicine

## 2015-07-10 ENCOUNTER — Encounter: Payer: BLUE CROSS/BLUE SHIELD | Admitting: Internal Medicine

## 2015-07-10 NOTE — Telephone Encounter (Addendum)
Error

## 2015-07-10 NOTE — Progress Notes (Signed)
Document opened and reviewed for OV but appt  canceled same day .  

## 2015-07-23 ENCOUNTER — Telehealth: Payer: Self-pay

## 2015-07-23 NOTE — Telephone Encounter (Signed)
The pt is hoping to come in on a day after 4pm for a blood pressure check. Is this okay?  Pt callback - [phone]

## 2015-07-23 NOTE — Telephone Encounter (Signed)
No BP checks.  She is over due for office visit with [facility] to check BP.  Please help her to make that appointment.  Thanks!

## 2015-07-24 NOTE — Telephone Encounter (Signed)
lmom for pt to call back

## 2015-07-26 NOTE — Telephone Encounter (Signed)
lmom for pt to sch an appt °

## 2015-07-30 ENCOUNTER — Other Ambulatory Visit: Payer: Self-pay | Admitting: Internal Medicine

## 2015-07-30 NOTE — Telephone Encounter (Signed)
Pt has been sch

## 2015-08-01 NOTE — Telephone Encounter (Signed)
Sent to the pharmacy by e-scribe.  Pt has an appt on 08/16/15

## 2015-08-07 ENCOUNTER — Other Ambulatory Visit: Payer: Self-pay | Admitting: Internal Medicine

## 2015-08-07 NOTE — Telephone Encounter (Signed)
Sent to the pharmacy by e-scribe. 

## 2015-08-16 ENCOUNTER — Ambulatory Visit (INDEPENDENT_AMBULATORY_CARE_PROVIDER_SITE_OTHER): Payer: BLUE CROSS/BLUE SHIELD | Admitting: Internal Medicine

## 2015-08-16 ENCOUNTER — Encounter: Payer: Self-pay | Admitting: Internal Medicine

## 2015-08-16 VITALS — BP 152/96 | Temp 98.2°F | Wt 230.9 lb

## 2015-08-16 DIAGNOSIS — Z2821 Immunization not carried out because of patient refusal: Secondary | ICD-10-CM | POA: Diagnosis not present

## 2015-08-16 DIAGNOSIS — Z720 Tobacco use: Secondary | ICD-10-CM | POA: Diagnosis not present

## 2015-08-16 DIAGNOSIS — I10 Essential (primary) hypertension: Secondary | ICD-10-CM

## 2015-08-16 DIAGNOSIS — F17200 Nicotine dependence, unspecified, uncomplicated: Secondary | ICD-10-CM

## 2015-08-16 DIAGNOSIS — N979 Female infertility, unspecified: Secondary | ICD-10-CM

## 2015-08-16 MED ORDER — METHYLDOPA 500 MG PO TABS: 1000.0000 mg | ORAL_TABLET | Freq: Two times a day (BID) | ORAL | Status: DC

## 2015-08-16 NOTE — Patient Instructions (Addendum)
It appears that the labetalol doesn't help you. Control your blood pressure.  Increase methyldopa to 1000 mg in the morning (2 -500 mg)   and remain on 500 mg in the evening.   after 2 weeks increase to 1000 mg in the morning and 1000 mg in the evening (2- 500 mg twice a day).   Wean off of the labetalol in the meantime by 1-2 tablets every 2-4 days. If your blood pressure shoots up very high go slower.  I suspect that the higher dose of the methyldopa may not be enough to control the blood pressure. If needed we will add on long acting nifedipine 30 mg a day.  Reassess in 4-6 weeks to make this decision. Can call  Instead of office visit   Depending on how  doing.  However in the meantime if you have questions contact our office or send in a message by my chart. Still advise stopping tobacco.

## 2015-08-16 NOTE — Progress Notes (Signed)
Pre visit review using our clinic review tool, if applicable. No additional management support is needed unless otherwise documented below in the visit note.  Chief Complaint  Patient presents with  . Follow-up    HPI: Shirley Alvarez 34 y.o.  comes in for  medication management  HT tobacco fertility rx . Since her last visit she has increased the labetalol to 600 mg twice a day. She does not think it helps her and thinks it makes her blood pressure worse. She is continued on methyldopa 500 mg twice a day. She only misses a dose of the labetalol 2-3 x times a week. No other change in her health otherwise. She states in the past the methyldopa had helped her blood pressure. Asks about severity. She states she is getting elevated readings at home also similar to the office. 150 and above. Occasional 145. ROS: See pertinent positives and negatives per HPI.  Past Medical History  Diagnosis Date  . Hypertension   . Pelvic adhesions   . Hydrosalpinx     LEFT  . History of ectopic pregnancy     X3   last one 07/ 2015  (received MTX injection)  . Allergic rhinitis   . Wears glasses     Family History  Problem Relation Age of Onset  . Liver disease Father   . Asthma Sister   . Asthma Sister   . Anesthesia problems Other     Social History   Social History  . Marital Status: Married    Spouse Name: N/A  . Number of Children: N/A  . Years of Education: N/A   Social History Main Topics  . Smoking status: Current Some Day Smoker -- 0.25 packs/day for 10 years    Types: Cigarettes  . Smokeless tobacco: Never Used  . Alcohol Use: No  . Drug Use: No  . Sexual Activity: Yes   Other Topics Concern  . None   Social History Narrative   Early Childhood education   Kindergarten age group      History of miscarriage December 2011    Outpatient Prescriptions Prior to Visit  Medication Sig Dispense Refill  . acetaminophen (TYLENOL) 500 MG tablet Take 1,000 mg by mouth 4  (four) times daily as needed for headache.     . albuterol (PROAIR HFA) 108 (90 BASE) MCG/ACT inhaler Inhale 2 puffs into the lungs every 4 (four) hours as needed for wheezing or shortness of breath. 1 Inhaler 0  . cetirizine (ZYRTEC) 10 MG tablet Take 1 tablet (10 mg total) by mouth daily. 30 tablet 3  . fluticasone (FLONASE) 50 MCG/ACT nasal spray Place 2 sprays into both nostrils daily as needed for allergies.    . fluticasone (FLONASE) 50 MCG/ACT nasal spray instill 2 sprays into each nostril once daily 16 g 2  . ibuprofen (ADVIL,MOTRIN) 800 MG tablet Take 800 mg by mouth 2 (two) times daily as needed (for menstrual pain.).     Marland Kitchen labetalol (NORMODYNE) 200 MG tablet Take 3 tablets (600 mg total) by mouth 2 (two) times daily. 180 tablet 1  . labetalol (NORMODYNE) 200 MG tablet Take 3 tablets (600 mg total) by mouth 2 (two) times daily. 180 tablet 0  . methyldopa (ALDOMET) 500 MG tablet take 1 tablet by mouth twice a day 60 tablet 0   No facility-administered medications prior to visit.     EXAM:  BP 152/96 mmHg  Temp(Src) 98.2 F (36.8 C) (Oral)  Wt 230 lb 14.4 oz (  104.736 kg)  Body mass index is 44.34 kg/(m^2). Repeat blood pressure 154/96. GENERAL: vitals reviewed and listed above, alert, oriented, appears well hydrated and in no acute distress looks well today HEENT: atraumatic, conjunctiva  clear, no obvious abnormalities on inspection of external nose and ears MS: moves all extremities without noticeable focal  abnormality PSYCH: pleasant and cooperative, no obvious depression or anxiety BP Readings from Last 3 Encounters:  08/16/15 152/96  05/29/15 146/98  11/07/14 153/83   Wt Readings from Last 3 Encounters:  08/16/15 230 lb 14.4 oz (104.736 kg)  05/29/15 228 lb 11.2 oz (103.738 kg)  11/07/14 230 lb (104.327 kg)     ASSESSMENT AND PLAN:  Discussed the following assessment and plan:  Essential hypertension - labetolol not helping at all ok with aldomet increase dose  wean labetolol and may need to add la  nefedipine etc.   Tobacco use disorder  fertility issue - per gyne  Influenza vaccination declined Reviewed  Plan  And changes    And decidde on intervention at the 4-6 weeks mark if if unable to continue the plan  -Patient advised to return or notify health care team  if symptoms worsen ,persist or new concerns arise.  Patient Instructions  It appears that the labetalol doesn't help you. Control your blood pressure.  Increase methyldopa to 1000 mg in the morning (2 -500 mg)   and remain on 500 mg in the evening.   after 2 weeks increase to 1000 mg in the morning and 1000 mg in the evening (2- 500 mg twice a day).   Wean off of the labetalol in the meantime by 1-2 tablets every 2-4 days. If your blood pressure shoots up very high go slower.  I suspect that the higher dose of the methyldopa may not be enough to control the blood pressure. If needed we will add on long acting nifedipine 30 mg a day.  Reassess in 4-6 weeks to make this decision. Can call  Instead of office visit   Depending on how  doing.  However in the meantime if you have questions contact our office or send in a message by my chart. Still advise stopping tobacco.    Standley Brooking. Panosh M.D.

## 2015-09-10 ENCOUNTER — Encounter: Payer: Self-pay | Admitting: Internal Medicine

## 2015-09-10 ENCOUNTER — Ambulatory Visit (INDEPENDENT_AMBULATORY_CARE_PROVIDER_SITE_OTHER): Payer: BLUE CROSS/BLUE SHIELD | Admitting: Internal Medicine

## 2015-09-10 VITALS — BP 136/82 | Temp 98.8°F | Wt 228.3 lb

## 2015-09-10 DIAGNOSIS — F17200 Nicotine dependence, unspecified, uncomplicated: Secondary | ICD-10-CM | POA: Diagnosis not present

## 2015-09-10 DIAGNOSIS — I10 Essential (primary) hypertension: Secondary | ICD-10-CM | POA: Diagnosis not present

## 2015-09-10 MED ORDER — METHYLDOPA 500 MG PO TABS: ORAL_TABLET | ORAL | Status: DC

## 2015-09-10 NOTE — Progress Notes (Signed)
Pre visit review using our clinic review tool, if applicable. No additional management support is needed unless otherwise documented below in the visit note.  Chief Complaint  Patient presents with  . Follow-up    HPI: Shirley Alvarez 34 y.o. comes in for management of ht  In preconception fertility rx   Did not respond labetilol and last visit inc the aldomet  Only taking 23 per day and misses second dose about 3 x per week  Some drowsiness but otherwise feels fine   Still using tobacco  ROS: See pertinent positives and negatives per HPI.  Past Medical History  Diagnosis Date  . Hypertension   . Pelvic adhesions   . Hydrosalpinx     LEFT  . History of ectopic pregnancy     X3   last one 07/ 2015  (received MTX injection)  . Allergic rhinitis   . Wears glasses     Family History  Problem Relation Age of Onset  . Liver disease Father   . Asthma Sister   . Asthma Sister   . Anesthesia problems Other     Social History   Social History  . Marital Status: Married    Spouse Name: N/A  . Number of Children: N/A  . Years of Education: N/A   Social History Main Topics  . Smoking status: Current Some Day Smoker -- 0.25 packs/day for 10 years    Types: Cigarettes  . Smokeless tobacco: Never Used  . Alcohol Use: No  . Drug Use: No  . Sexual Activity: Yes   Other Topics Concern  . None   Social History Narrative   Early Childhood education   Kindergarten age group      History of miscarriage December 2011    Outpatient Prescriptions Prior to Visit  Medication Sig Dispense Refill  . acetaminophen (TYLENOL) 500 MG tablet Take 1,000 mg by mouth 4 (four) times daily as needed for headache.     . albuterol (PROAIR HFA) 108 (90 BASE) MCG/ACT inhaler Inhale 2 puffs into the lungs every 4 (four) hours as needed for wheezing or shortness of breath. 1 Inhaler 0  . cetirizine (ZYRTEC) 10 MG tablet Take 1 tablet (10 mg total) by mouth daily. 30 tablet 3  . fluticasone  (FLONASE) 50 MCG/ACT nasal spray instill 2 sprays into each nostril once daily 16 g 2  . ibuprofen (ADVIL,MOTRIN) 800 MG tablet Take 800 mg by mouth 2 (two) times daily as needed (for menstrual pain.).     Marland Kitchen labetalol (NORMODYNE) 200 MG tablet Take 3 tablets (600 mg total) by mouth 2 (two) times daily. 180 tablet 0  . methyldopa (ALDOMET) 500 MG tablet Take 2 tablets (1,000 mg total) by mouth 2 (two) times daily. 120 tablet 2  . fluticasone (FLONASE) 50 MCG/ACT nasal spray Place 2 sprays into both nostrils daily as needed for allergies.    Marland Kitchen labetalol (NORMODYNE) 200 MG tablet Take 3 tablets (600 mg total) by mouth 2 (two) times daily. 180 tablet 1   No facility-administered medications prior to visit.     EXAM:  BP 136/82 mmHg  Temp(Src) 98.8 F (37.1 C) (Oral)  Wt 228 lb 4.8 oz (103.556 kg)  Body mass index is 43.84 kg/(m^2).  GENERAL: vitals reviewed and listed above, alert, oriented, appears well hydrated and in no acute distress PSYCH: pleasant and cooperative, no obvious depression or anxiety  bp repeat and confirmed   ASSESSMENT AND PLAN:  Discussed the following assessment and plan:  Essential hypertension - better  to  optimize 1500 mg per day   Tobacco use disorder Wean off labetolol totally  Take 3 500  Aldomet every day  Disc about how to adhere to this    Contact us with bp readings in a month and if ok rov 3 months   If not controlled can increase to  4- 500 per day  -Patient advised to return or notify health care team  if symptoms worsen ,persist or new concerns arise.  Patient Instructions  Ok to wean off of the labetolol  1/2 dose 100 mg  Per day for a few days and then stop.  Work on taking  Same dose of methyldopa every day   2 in am and 1 in pm   ( 6 and 6 )   contat [place] in a month about  The bp readings  .   (We can always increase dose but probably not necessary if able to take regularly .)   ROV in about 3 months  Or as needed        Mariann Laster  K. Mauriana Dann M.D.

## 2015-09-10 NOTE — Patient Instructions (Signed)
Ok to wean off of the labetolol  1/2 dose 100 mg  Per day for a few days and then stop.  Work on taking  Same dose of methyldopa every day   2 in am and 1 in pm   ( 6 and 6 )   contat [place] in a month about  The bp readings  .   (We can always increase dose but probably not necessary if able to take regularly .)   ROV in about 3 months  Or as needed

## 2015-11-18 ENCOUNTER — Encounter: Payer: Self-pay | Admitting: Podiatry

## 2015-11-18 ENCOUNTER — Ambulatory Visit (INDEPENDENT_AMBULATORY_CARE_PROVIDER_SITE_OTHER): Payer: BLUE CROSS/BLUE SHIELD | Admitting: Podiatry

## 2015-11-18 VITALS — BP 135/89 | HR 96 | Resp 16 | Ht 62.0 in | Wt 226.0 lb

## 2015-11-18 DIAGNOSIS — L600 Ingrowing nail: Secondary | ICD-10-CM

## 2015-11-18 NOTE — Patient Instructions (Signed)

## 2015-11-18 NOTE — Progress Notes (Signed)
   Subjective:    Patient ID: Shirley Alvarez, female    DOB: 1981-07-01, 34 y.o.   MRN: [id number]  HPI Pt presents with a nail problem on their right foot; great toe-medial side; x3 months. Pt used peroxide with some relief.   Review of Systems  HENT: Positive for sinus pressure.   Eyes: Positive for visual disturbance.  Allergic/Immunologic: Positive for environmental allergies.  Neurological: Positive for headaches.  All other systems reviewed and are negative.      Objective:   Physical Exam        Assessment & Plan:

## 2015-11-20 ENCOUNTER — Telehealth: Payer: Self-pay | Admitting: *Deleted

## 2015-11-20 NOTE — Telephone Encounter (Signed)
Left message for patient at [phone] (Cell #) to check to see how they were doing from their ingrown toenail procedure that was performed on Monday, November 18, 2015. Waiting for a response.

## 2015-11-21 NOTE — Progress Notes (Signed)
Subjective:     Patient ID: Shirley Alvarez, female   DOB: 04-16-1981, 34 y.o.   MRN: [id number]  HPI patient presents stating I have a painful ingrown toenail on my right big toe that I tried use peroxide on trim and soak without relief. Been present for around 3 months   Review of Systems  All other systems reviewed and are negative.      Objective:   Physical Exam  Constitutional: She is oriented to person, place, and time.  Cardiovascular: Intact distal pulses.   Musculoskeletal: Normal range of motion.  Neurological: She is oriented to person, place, and time.  Skin: Skin is warm.  Nursing note and vitals reviewed.  neurovascular status intact muscle strength adequate range of motion within normal limits with patient found to have incurvated right hallux medial border that's painful when pressed with distal redness but no drainage noted currently patient's found to have good digital perfusion and is well oriented 3     Assessment:     Ingrown toenail deformity right hallux medial border with pain    Plan:     H&P condition reviewed with patient and recommended correction. Explained procedure and risk and patient wants surgery and today I infiltrated 60 mg Xylocaine Marcaine mixture and under sterile conditions I removed the medial border exposed matrix and applied phenol 3 applications 30 seconds followed by alcohol lavage and sterile dressing. Gave instructions on soaks and reappoint

## 2015-11-22 ENCOUNTER — Other Ambulatory Visit: Payer: Self-pay | Admitting: Obstetrics and Gynecology

## 2015-11-22 DIAGNOSIS — O009 Unspecified ectopic pregnancy without intrauterine pregnancy: Secondary | ICD-10-CM

## 2015-11-23 ENCOUNTER — Inpatient Hospital Stay (HOSPITAL_COMMUNITY)
Admission: AD | Admit: 2015-11-23 | Discharge: 2015-11-23 | Disposition: A | Discharge status: Home / Self Care | Payer: BLUE CROSS/BLUE SHIELD | Source: Ambulatory Visit | Attending: Obstetrics and Gynecology | Admitting: Obstetrics and Gynecology

## 2015-11-23 ENCOUNTER — Encounter (HOSPITAL_COMMUNITY): Payer: Self-pay | Admitting: *Deleted

## 2015-11-23 DIAGNOSIS — Z3A01 Less than 8 weeks gestation of pregnancy: Secondary | ICD-10-CM | POA: Insufficient documentation

## 2015-11-23 DIAGNOSIS — Z3491 Encounter for supervision of normal pregnancy, unspecified, first trimester: Secondary | ICD-10-CM | POA: Diagnosis not present

## 2015-11-23 LAB — CBC WITH DIFFERENTIAL/PLATELET
BASOS PCT: 0 %
Basophils Absolute: 0 10*3/uL (ref 0.0–0.1)
EOS ABS: 0.1 10*3/uL (ref 0.0–0.7)
Eosinophils Relative: 2 %
HCT: 35.3 % — ABNORMAL LOW (ref 36.0–46.0)
HEMOGLOBIN: 11.4 g/dL — AB (ref 12.0–15.0)
LYMPHS ABS: 2.1 10*3/uL (ref 0.7–4.0)
Lymphocytes Relative: 30 %
MCH: 26.5 pg (ref 26.0–34.0)
MCHC: 32.3 g/dL (ref 30.0–36.0)
MCV: 81.9 fL (ref 78.0–100.0)
Monocytes Absolute: 0.3 10*3/uL (ref 0.1–1.0)
Monocytes Relative: 4 %
NEUTROS ABS: 4.5 10*3/uL (ref 1.7–7.7)
NEUTROS PCT: 64 %
Platelets: 302 10*3/uL (ref 150–400)
RBC: 4.31 MIL/uL (ref 3.87–5.11)
RDW: 15.3 % (ref 11.5–15.5)
WBC: 7 10*3/uL (ref 4.0–10.5)

## 2015-11-23 LAB — TYPE AND SCREEN
ABO/RH(D): O POS
Antibody Screen: POSITIVE
DAT, IgG: POSITIVE

## 2015-11-23 LAB — CREATININE, SERUM
CREATININE: 0.82 mg/dL (ref 0.44–1.00)
GFR calc Af Amer: >60 mL/min (ref >60)

## 2015-11-23 LAB — AST: AST: 19 U/L (ref 15–41)

## 2015-11-23 LAB — BUN: BUN: 8 mg/dL (ref 6–20)

## 2015-11-23 LAB — HCG, QUANTITATIVE, PREGNANCY: HCG, BETA CHAIN, QUANT, S: 113 m[IU]/mL — AB (ref <5)

## 2015-11-23 MED ORDER — METHOTREXATE INJECTION FOR WOMEN'S HOSPITAL
25.0000 mg/m2 | Freq: Once | INTRAMUSCULAR | Status: AC
  Administered 2015-11-23: 55 mg via INTRAMUSCULAR
  Filled 2015-11-23: qty 1.1

## 2015-11-23 NOTE — MAU Note (Signed)
Pt here for labs & MTX from Dr. Andy Gauss office.  Pt denies pain or bleeding.

## 2015-11-23 NOTE — Discharge Instructions (Signed)
Methotrexate Treatment for an Ectopic Pregnancy °Methotrexate is a medicine that treats ectopic pregnancy by stopping the growth of the fertilized egg. It also helps your body absorb tissue from the egg. This takes between 2 weeks and 6 weeks. Most ectopic pregnancies can be successfully treated with methotrexate if they are detected early enough. °LET YOUR HEALTH CARE PROVIDER KNOW ABOUT: °· Any allergies you have. °· All medicines you are taking, including vitamins, herbs, eye drops, creams, and over-the-counter medicines. °· Medical conditions you have. °RISKS AND COMPLICATIONS °Generally, this is a safe treatment. However, as with any treatment, problems can occur. Possible problems or side effects include: °· Nausea. °· Vomiting. °· Diarrhea. °· Abdominal cramping. °· Mouth sores. °· Increased vaginal bleeding or spotting.   °· Swelling or irritation of the lining of your lungs (pneumonitis).  °· Failed treatment and continuation of the pregnancy.   °· Liver damage. °· Hair loss. °There is still a risk of the ectopic pregnancy rupturing while using the methotrexate. °BEFORE THE PROCEDURE °Before you take the medicine:  °· Liver tests, kidney tests, and a complete blood test are performed. °· Blood tests are performed to measure the pregnancy hormone levels and to determine your blood type. °· If you are Rh-negative and the father is Rh-positive or his Rh type is not known, you will be given a Rho (D) immune globulin shot. °PROCEDURE  °There are two methods that your health care provider may use to prescribe methotrexate. One method involves a single dose or injection of the medicine. Another method involves a series of doses given through several injections.  °AFTER THE PROCEDURE °· You may have some abdominal cramping, vaginal bleeding, and fatigue in the first few days after taking methotrexate. °· Blood tests will be taken for several weeks to check the pregnancy hormone levels. The blood tests are performed  until there is no more pregnancy hormone detected in the blood. °  °This information is not intended to replace advice given to you by your health care provider. Make sure you discuss any questions you have with your health care provider. °  °Document Released: 11/03/2001 Document Revised: 11/30/2014 Document Reviewed: 08/28/2013 °Elsevier Interactive Patient Education ©2016 [employer] ° °Ectopic Pregnancy °An ectopic pregnancy is when the fertilized egg attaches (implants) outside the uterus. Most ectopic pregnancies occur in the fallopian tube. Rarely do ectopic pregnancies occur on the ovary, intestine, pelvis, or cervix. In an ectopic pregnancy, the fertilized egg does not have the ability to develop into a normal, healthy baby.  °A ruptured ectopic pregnancy is one in which the fallopian tube gets torn or bursts and results in internal bleeding. Often there is intense abdominal pain, and sometimes, vaginal bleeding. Having an ectopic pregnancy can be life threatening. If left untreated, this dangerous condition can lead to a blood transfusion, abdominal surgery, or even death. °CAUSES  °Damage to the fallopian tubes is the suspected cause in most ectopic pregnancies.  °RISK FACTORS °Depending on your circumstances, the risk of having an ectopic pregnancy will vary. The level of risk can be divided into three categories. °High Risk °· You have gone through infertility treatment. °· You have had a previous ectopic pregnancy. °· You have had previous tubal surgery. °· You have had previous surgery to have the fallopian tubes tied (tubal ligation). °· You have tubal problems or diseases. °· You have been exposed to DES. DES is a medicine that was used until 1971 and had effects on babies whose mothers took the medicine. °·   You become pregnant while using an intrauterine device (IUD) for birth control.  °Moderate Risk °· You have a history of infertility. °· You have a history of a sexually transmitted infection  (STI). °· You have a history of pelvic inflammatory disease (PID). °· You have scarring from endometriosis. °· You have multiple sexual partners. °· You smoke.  °Low Risk °· You have had previous pelvic surgery. °· You use vaginal douching. °· You became sexually active before 34 years of age. °SIGNS AND SYMPTOMS  °An ectopic pregnancy should be suspected in anyone who has missed a period and has abdominal pain or bleeding. °· You may experience normal pregnancy symptoms, such as: °¨ Nausea. °¨ Tiredness. °¨ Breast tenderness. °· Other symptoms may include: °¨ Pain with intercourse. °¨ Irregular vaginal bleeding or spotting. °¨ Cramping or pain on one side or in the lower abdomen. °¨ Fast heartbeat. °¨ Passing out while having a bowel movement. °· Symptoms of a ruptured ectopic pregnancy and internal bleeding may include: °¨ Sudden, severe pain in the abdomen and pelvis. °¨ Dizziness or fainting. °¨ Pain in the shoulder area. °DIAGNOSIS  °Tests that may be performed include: °· A pregnancy test. °· An ultrasound test. °· Testing the specific level of pregnancy hormone in the bloodstream. °· Taking a sample of uterus tissue (dilation and curettage, D&C). °· Surgery to perform a visual exam of the inside of the abdomen using a thin, lighted tube with a tiny camera on the end (laparoscope). °TREATMENT  °An injection of a medicine called methotrexate may be given. This medicine causes the pregnancy tissue to be absorbed. It is given if: °· The diagnosis is made early. °· The fallopian tube has not ruptured. °· You are considered to be a good candidate for the medicine. °Usually, pregnancy hormone blood levels are checked after methotrexate treatment. This is to be sure the medicine is effective. It may take 4-6 weeks for the pregnancy to be absorbed (though most pregnancies will be absorbed by 3 weeks). °Surgical treatment may be needed. A laparoscope may be used to remove the pregnancy tissue. If severe internal  bleeding occurs, a cut (incision) may be made in the lower abdomen (laparotomy), and the ectopic pregnancy is removed. This stops the bleeding. Part of the fallopian tube, or the whole tube, may be removed as well (salpingectomy). After surgery, pregnancy hormone tests may be done to be sure there is no pregnancy tissue left. You may receive a Rho (D) immune globulin shot if you are Rh negative and the father is Rh positive, or if you do not know the Rh type of the father. This is to prevent problems with any future pregnancy. °SEEK IMMEDIATE MEDICAL CARE IF:  °You have any symptoms of an ectopic pregnancy. This is a medical emergency. °MAKE SURE YOU: °· Understand these instructions. °· Will watch your condition. °· Will get help right away if you are not doing well or get worse. °  °This information is not intended to replace advice given to you by your health care provider. Make sure you discuss any questions you have with your health care provider. °  °Document Released: 12/17/2004 Document Revised: 11/30/2014 Document Reviewed: 06/08/2013 °Elsevier Interactive Patient Education ©2016 Elsevier Inc. ° °

## 2015-11-23 NOTE — MAU Note (Signed)
MTX education handout given.

## 2015-11-23 NOTE — MAU Note (Signed)
Labs reviewed with Dr. Alesia Richards, she spoke with Dr. Simona Huh.  OK to proceed with MTX.

## 2015-11-26 ENCOUNTER — Inpatient Hospital Stay (HOSPITAL_COMMUNITY)
Admission: AD | Admit: 2015-11-26 | Discharge: 2015-11-26 | Discharge status: Against Medical Advice | Payer: BLUE CROSS/BLUE SHIELD | Source: Ambulatory Visit | Attending: Obstetrics and Gynecology | Admitting: Obstetrics and Gynecology

## 2015-11-26 DIAGNOSIS — Z5320 Procedure and treatment not carried out because of patient's decision for unspecified reasons: Secondary | ICD-10-CM | POA: Insufficient documentation

## 2015-11-26 DIAGNOSIS — O009 Unspecified ectopic pregnancy without intrauterine pregnancy: Secondary | ICD-10-CM | POA: Diagnosis not present

## 2015-11-26 LAB — HCG, QUANTITATIVE, PREGNANCY: hCG, Beta Chain, Quant, S: 67 m[IU]/mL — ABNORMAL HIGH (ref <5)

## 2015-11-26 NOTE — MAU Note (Signed)
Pt had labs drawn, but left once they were drawn. Pt was not triaged by nurse nor seen by provider.

## 2015-11-26 NOTE — MAU Note (Signed)
Not in lobby

## 2016-01-01 ENCOUNTER — Other Ambulatory Visit: Payer: Self-pay | Admitting: Family Medicine

## 2016-01-01 NOTE — Telephone Encounter (Signed)
Pt is pregnant.  Ok to fill?

## 2016-01-02 ENCOUNTER — Telehealth: Payer: Self-pay | Admitting: Family Medicine

## 2016-01-02 MED ORDER — METHYLDOPA 500 MG PO TABS: ORAL_TABLET | ORAL | Status: DC

## 2016-01-02 NOTE — Telephone Encounter (Signed)
Pt needs a follow up with Dr. Regis Bill within 3 months.  Please help her to make appointment.  Thanks!

## 2016-01-02 NOTE — Telephone Encounter (Signed)
Sent to the pharmacy by e-scribe.  Message sent to scheduling to help her make an appt.

## 2016-01-02 NOTE — Telephone Encounter (Signed)
lmom for pt to call back

## 2016-01-02 NOTE — Telephone Encounter (Signed)
Ok x 90 days  Ov before runs out of med

## 2016-01-07 NOTE — Telephone Encounter (Signed)
Pt has been scheduled.  °

## 2016-01-07 NOTE — Telephone Encounter (Signed)
lmom for pt to call back

## 2016-03-03 ENCOUNTER — Encounter: Payer: Self-pay | Admitting: Internal Medicine

## 2016-03-03 ENCOUNTER — Ambulatory Visit (INDEPENDENT_AMBULATORY_CARE_PROVIDER_SITE_OTHER): Payer: BLUE CROSS/BLUE SHIELD | Admitting: Internal Medicine

## 2016-03-03 VITALS — BP 120/78 | HR 74 | Temp 98.9°F | Ht 62.0 in | Wt 225.0 lb

## 2016-03-03 DIAGNOSIS — I10 Essential (primary) hypertension: Secondary | ICD-10-CM | POA: Diagnosis not present

## 2016-03-03 DIAGNOSIS — N979 Female infertility, unspecified: Secondary | ICD-10-CM | POA: Diagnosis not present

## 2016-03-03 DIAGNOSIS — F17200 Nicotine dependence, unspecified, uncomplicated: Secondary | ICD-10-CM

## 2016-03-03 DIAGNOSIS — R05 Cough: Secondary | ICD-10-CM | POA: Diagnosis not present

## 2016-03-03 DIAGNOSIS — R059 Cough, unspecified: Secondary | ICD-10-CM

## 2016-03-03 MED ORDER — METHYLDOPA 500 MG PO TABS: 1000.0000 mg | ORAL_TABLET | Freq: Two times a day (BID) | ORAL | Status: DC

## 2016-03-03 MED ORDER — VARENICLINE TARTRATE 0.5 MG X 11 & 1 MG X 42 PO MISC: ORAL | Status: DC

## 2016-03-03 NOTE — Progress Notes (Signed)
Chief Complaint  Patient presents with  . Hypertension    meds    HPI: Shirley Alvarez 35 y.o.  comesi n for fu  bp control  Fertility   Had another ectopic andr x with mtx  In January  Cough   No fever   For 2 weeks  Nose stuffy  No sob  Still tobacco  Asks about chantix  bp has been good   Sleepiness othewise no se of med ROS: See pertinent positives and negatives per HPI.  Past Medical History  Diagnosis Date  . Hypertension   . Pelvic adhesions   . Hydrosalpinx     LEFT  . History of ectopic pregnancy     X3   last one 07/ 2015  (received MTX injection)  . Allergic rhinitis   . Wears glasses     Family History  Problem Relation Age of Onset  . Liver disease Father   . Asthma Sister   . Asthma Sister   . Anesthesia problems Other     Social History   Social History  . Marital Status: Married    Spouse Name: N/A  . Number of Children: N/A  . Years of Education: N/A   Social History Main Topics  . Smoking status: Current Some Day Smoker -- 0.25 packs/day for 10 years    Types: Cigarettes  . Smokeless tobacco: Never Used  . Alcohol Use: No  . Drug Use: No  . Sexual Activity: Yes   Other Topics Concern  . None   Social History Narrative   Early Childhood education   Kindergarten age group      History of miscarriage December 2011    Outpatient Prescriptions Prior to Visit  Medication Sig Dispense Refill  . acetaminophen (TYLENOL) 500 MG tablet Take 1,000 mg by mouth 4 (four) times daily as needed for headache.     . albuterol (PROAIR HFA) 108 (90 BASE) MCG/ACT inhaler Inhale 2 puffs into the lungs every 4 (four) hours as needed for wheezing or shortness of breath. 1 Inhaler 0  . cetirizine (ZYRTEC) 10 MG tablet Take 1 tablet (10 mg total) by mouth daily. (Patient taking differently: Take 10 mg by mouth as needed. ) 30 tablet 3  . fluticasone (FLONASE) 50 MCG/ACT nasal spray instill 2 sprays into each nostril once daily (Patient taking  differently: instill 2 sprays into each nostril as needed) 16 g 2  . methyldopa (ALDOMET) 500 MG tablet Take 1 in am and 1 in pm can increase to 2 bid if needed (Patient taking differently: 1,000 mg 2 (two) times daily. ) 120 tablet 2  . ibuprofen (ADVIL,MOTRIN) 800 MG tablet Take 800 mg by mouth 2 (two) times daily as needed (for menstrual pain.). Reported on 03/03/2016     No facility-administered medications prior to visit.     EXAM:  BP 120/78 mmHg  Pulse 74  Temp(Src) 98.9 F (37.2 C)  Ht 5\' 2"  (1.575 m)  Wt 225 lb (102.059 kg)  BMI 41.14 kg/m2  LMP 10/24/2015  Body mass index is 41.14 kg/(m^2).  GENERAL: vitals reviewed and listed above, alert, oriented, appears well hydrated and in no acute distress HEENT: atraumatic, conjunctiva  clear, no obvious abnormalities on inspection of external nose and ears slightly stuffy looks allergic OP : no lesion edema or exudate  NECK: no obvious masses on inspection palpation  LUNGS: clear to auscultation bilaterally, no wheezes, rales or rhonchi, good air movement CV: HRRR, no clubbing cyanosis  or  peripheral edema nl cap refill  MS: moves all extremities without noticeable focal  abnormality PSYCH: pleasant and cooperative, no obvious depression or anxiety BP Readings from Last 3 Encounters:  03/03/16 120/78  11/23/15 129/82  11/18/15 135/89   Wt Readings from Last 3 Encounters:  03/03/16 225 lb (102.059 kg)  11/23/15 230 lb (104.327 kg)  11/18/15 226 lb (102.513 kg)     ASSESSMENT AND PLAN:  Discussed the following assessment and plan:  Essential hypertension - better currently acceptable  se to patient  fertility  context  Tobacco use disorder - pt asks about  chantix  ( rx fgiven not to use oin preg can let us know if wants to do refill)  fertility issue  Cough - take  flonase every day for at least 2 weeks  Still sto[ tobacco  -Patient advised to return or notify health care team  if symptoms worsen ,persist or new  concerns arise.  Patient Instructions  Continue same bp med  Working well.  Continue to try to stop tobacco  Consider chantix but wouldn't advise taking this in pregnancy  At this time.    Call fi you want a maintenance  Pack for  chantix   ROV in 6 months or a s  needed      Standley Brooking. Panosh M.D.

## 2016-03-03 NOTE — Patient Instructions (Signed)
Continue same bp med  Working well.  Continue to try to stop tobacco  Consider chantix but wouldn't advise taking this in pregnancy  At this time.    Call fi you want a maintenance  Pack for  chantix   ROV in 6 months or a s  needed

## 2016-03-26 NOTE — Addendum Note (Signed)
Addended byShanon Ace K on: 03/26/2016 05:51 PM   Modules accepted: Miquel Dunn

## 2016-03-26 NOTE — Progress Notes (Signed)
Addendum:  I  counseled patient about tobacco cessation and then gave her a prescription of chantix   to try . As noted in the   Office note .Marland Kitchen 10 minutes time

## 2016-05-15 ENCOUNTER — Ambulatory Visit (HOSPITAL_COMMUNITY)
Admission: RE | Admit: 2016-05-15 | Discharge: 2016-05-15 | Disposition: A | Discharge status: Home / Self Care | Payer: BLUE CROSS/BLUE SHIELD | Source: Ambulatory Visit | Attending: Obstetrics and Gynecology | Admitting: Obstetrics and Gynecology

## 2016-05-15 ENCOUNTER — Other Ambulatory Visit: Payer: Self-pay | Admitting: Obstetrics and Gynecology

## 2016-05-15 DIAGNOSIS — O4691 Antepartum hemorrhage, unspecified, first trimester: Secondary | ICD-10-CM | POA: Diagnosis present

## 2016-05-15 DIAGNOSIS — Z3A01 Less than 8 weeks gestation of pregnancy: Secondary | ICD-10-CM | POA: Insufficient documentation

## 2016-05-15 DIAGNOSIS — O209 Hemorrhage in early pregnancy, unspecified: Secondary | ICD-10-CM

## 2016-06-01 ENCOUNTER — Other Ambulatory Visit: Payer: Self-pay | Admitting: Obstetrics and Gynecology

## 2016-06-01 ENCOUNTER — Other Ambulatory Visit (HOSPITAL_COMMUNITY)
Admission: RE | Admit: 2016-06-01 | Discharge: 2016-06-01 | Disposition: A | Discharge status: Home / Self Care | Payer: BLUE CROSS/BLUE SHIELD | Source: Ambulatory Visit | Attending: Obstetrics and Gynecology | Admitting: Obstetrics and Gynecology

## 2016-06-01 DIAGNOSIS — Z01419 Encounter for gynecological examination (general) (routine) without abnormal findings: Secondary | ICD-10-CM | POA: Insufficient documentation

## 2016-06-02 LAB — CYTOLOGY - PAP

## 2016-07-23 ENCOUNTER — Encounter: Payer: Self-pay | Admitting: Family Medicine

## 2016-07-23 ENCOUNTER — Telehealth: Payer: Self-pay | Admitting: *Deleted

## 2016-07-23 ENCOUNTER — Ambulatory Visit (INDEPENDENT_AMBULATORY_CARE_PROVIDER_SITE_OTHER): Payer: BLUE CROSS/BLUE SHIELD | Admitting: Family Medicine

## 2016-07-23 VITALS — BP 140/84 | HR 90 | Temp 98.5°F | Ht 62.0 in | Wt 224.9 lb

## 2016-07-23 DIAGNOSIS — J4520 Mild intermittent asthma, uncomplicated: Secondary | ICD-10-CM | POA: Diagnosis not present

## 2016-07-23 DIAGNOSIS — I10 Essential (primary) hypertension: Secondary | ICD-10-CM

## 2016-07-23 DIAGNOSIS — J069 Acute upper respiratory infection, unspecified: Secondary | ICD-10-CM | POA: Diagnosis not present

## 2016-07-23 MED ORDER — ALBUTEROL SULFATE HFA 108 (90 BASE) MCG/ACT IN AERS: 2.0000 | INHALATION_SPRAY | RESPIRATORY_TRACT | 0 refills | Status: DC | PRN

## 2016-07-23 MED ORDER — BENZONATATE 100 MG PO CAPS: 100.0000 mg | ORAL_CAPSULE | Freq: Three times a day (TID) | ORAL | 0 refills | Status: DC | PRN

## 2016-07-23 NOTE — Progress Notes (Signed)
Pre visit review using our clinic review tool, if applicable. No additional management support is needed unless otherwise documented below in the visit note. 

## 2016-07-23 NOTE — Telephone Encounter (Signed)
Printed Rx for the Albuterol inhaler was called in to [facility].

## 2016-07-23 NOTE — Patient Instructions (Signed)
BEFORE YOU LEAVE: -follow up: in next few months to recheck blood pressure   INSTRUCTIONS FOR UPPER RESPIRATORY INFECTION:  -use your inhaler as needed and seek care immediately if trouble breathing or asthma symptoms not responding to your albuterol  -nasal saline wash 2-3 times daily (use prepackaged nasal saline or bottled/distilled water if making your own)   -can use AFRIN nasal spray for drainage and nasal congestion - but do NOT use longer then 3-4 days  -can use tylenol (in no history of liver disease) or ibuprofen (if no history of kidney disease, bowel bleeding or significant heart disease) as directed for aches and sorethroat  -if you are taking a cough medication - use only as directed, may also try a teaspoon of honey to coat the throat and throat lozenges.  -for sore throat, salt water gargles can help  -follow up if you have fevers, facial pain, tooth pain, difficulty breathing or are worsening or symptoms persist longer then expected  Upper Respiratory Infection, Adult An upper respiratory infection (URI) is also known as the common cold. It is often caused by a type of germ (virus). Colds are easily spread (contagious). You can pass it to others by kissing, coughing, sneezing, or drinking out of the same glass. Usually, you get better in 1 to 3  weeks.  However, the cough can last for even longer. HOME CARE   Only take medicine as told by your doctor. Follow instructions provided above.  Drink enough water and fluids to keep your pee (urine) clear or pale yellow.  Get plenty of rest.  Return to work when your temperature is < 100 for 24 hours or as told by your doctor. You may use a face mask and wash your hands to stop your cold from spreading. GET HELP RIGHT AWAY IF:   After the first few days, you feel you are getting worse.  You have questions about your medicine.  You have chills, shortness of breath, or red spit (mucus).  You have pain in the face for more  then 1-2 days, especially when you bend forward.  You have a fever, puffy (swollen) neck, pain when you swallow, or white spots in the back of your throat.  You have a bad headache, ear pain, sinus pain, or chest pain.  You have a high-pitched whistling sound when you breathe in and out (wheezing).  You cough up blood.  You have sore muscles or a stiff neck. MAKE SURE YOU:   Understand these instructions.  Will watch your condition.  Will get help right away if you are not doing well or get worse. Document Released: 04/27/2008 Document Revised: 02/01/2012 Document Reviewed: 02/14/2014 [facility] Patient Information 2015 [place], [place]. This information is not intended to replace advice given to you by your health care provider. Make sure you discuss any questions you have with your health care provider.

## 2016-07-23 NOTE — Progress Notes (Signed)
HPI:  URI -started: 2 days ago -symptoms:nasal congestion, sore throat, cough, nausea, vomiting x2 yesterday - none today, loose stools -denies:fever, SOB, NVD, tooth pain -has tried: nothing -sick contacts/travel/risks: no reported flu, strep or tick exposure -Hx of: allergies, mild intermittent asthma - requests alb refill -"forgot to take" BP meds this morning ROS: See pertinent positives and negatives per HPI.  Past Medical History:  Diagnosis Date  . Allergic rhinitis   . History of ectopic pregnancy    X3   last one 07/ 2015  (received MTX injection)  . Hydrosalpinx    LEFT  . Hypertension   . Pelvic adhesions   . Wears glasses     Past Surgical History:  Procedure Laterality Date  . CHROMOPERTUBATION Bilateral 11/07/2014   Procedure: CHROMOPERTUBATION;  Surgeon: Governor Specking, MD;  Location: [facility];  Service: Gynecology;  Laterality: Bilateral;  . LAPAROSCOPIC LYSIS OF ADHESIONS N/A 11/07/2014   Procedure: LAPAROSCOPIC LYSIS OF ADHESIONS AND FIMBRIOPLASTY;  Surgeon: Governor Specking, MD;  Location: [facility];  Service: Gynecology;  Laterality: N/A;  . WISDOM TOOTH EXTRACTION      Family History  Problem Relation Age of Onset  . Liver disease Father   . Asthma Sister   . Asthma Sister   . Anesthesia problems Other     Social History   Social History  . Marital status: Married    Spouse name: N/A  . Number of children: N/A  . Years of education: N/A   Social History Main Topics  . Smoking status: Current Some Day Smoker    Packs/day: 0.25    Years: 10.00    Types: Cigarettes  . Smokeless tobacco: Never Used  . Alcohol use No  . Drug use: No  . Sexual activity: Yes   Other Topics Concern  . None   Social History Narrative   Early Childhood education   Kindergarten age group      History of miscarriage December 2011     Current Outpatient Prescriptions:  .  acetaminophen (TYLENOL) 500 MG tablet, Take  1,000 mg by mouth 4 (four) times daily as needed for headache. , Disp: , Rfl:  .  albuterol (PROAIR HFA) 108 (90 BASE) MCG/ACT inhaler, Inhale 2 puffs into the lungs every 4 (four) hours as needed for wheezing or shortness of breath., Disp: 1 Inhaler, Rfl: 0 .  cetirizine (ZYRTEC) 10 MG tablet, Take 1 tablet (10 mg total) by mouth daily. (Patient taking differently: Take 10 mg by mouth as needed. ), Disp: 30 tablet, Rfl: 3 .  fluticasone (FLONASE) 50 MCG/ACT nasal spray, instill 2 sprays into each nostril once daily (Patient taking differently: instill 2 sprays into each nostril as needed), Disp: 16 g, Rfl: 2 .  ibuprofen (ADVIL,MOTRIN) 800 MG tablet, Take 800 mg by mouth 2 (two) times daily as needed (for menstrual pain.). Reported on 03/03/2016, Disp: , Rfl:  .  methyldopa (ALDOMET) 500 MG tablet, Take 2 tablets (1,000 mg total) by mouth 2 (two) times daily., Disp: 120 tablet, Rfl: 5 .  varenicline (CHANTIX STARTING MONTH PAK) 0.5 MG X 11 & 1 MG X 42 tablet, Take one 0.5 mg tablet by mouth once daily for 3 days, then increase to one 0.5 mg tablet twice daily for 4 days, then increase to one 1 mg tablet twice daily., Disp: 53 tablet, Rfl: 1  EXAM:  Vitals:   07/23/16 1410  BP: 140/84  Pulse: 90  Temp: 98.5 F (36.9 C)  Body mass index is 41.13 kg/m.  GENERAL: vitals reviewed and listed above, alert, oriented, appears well hydrated and in no acute distress  HEENT: atraumatic, conjunttiva clear, no obvious abnormalities on inspection of external nose and ears, normal appearance of ear canals and TMs, clear nasal congestion, mild post oropharyngeal erythema with PND, no tonsillar edema or exudate, no sinus TTP  NECK: no obvious masses on inspection  LUNGS: clear to auscultation bilaterally, no wheezes, rales or rhonchi, good air movement  CV: HRRR, no peripheral edema  MS: moves all extremities without noticeable abnormality  PSYCH: pleasant and cooperative, no obvious depression or  anxiety  ASSESSMENT AND PLAN:  Discussed the following assessment and plan:  Acute upper respiratory infection  Intrinsic asthma, mild intermittent, uncomplicated  Essential hypertension  -given HPI and exam findings today, a serious infection or illness is unlikely. We discussed potential etiologies, with VURI being most likely, and advised supportive care and monitoring. Refilled alb to use as needed. Tessalon for cough. We discussed treatment side effects, likely course, antibiotic misuse, transmission, and signs of developing a serious illness. -restart bp meds and follow up with PCP advised -of course, we advised to return or notify a doctor immediately if symptoms worsen or persist or new concerns arise.    Patient Instructions  BEFORE YOU LEAVE: -follow up: in next few months to recheck blood pressure   INSTRUCTIONS FOR UPPER RESPIRATORY INFECTION:  -use your inhaler as needed and seek care immediately if trouble breathing or asthma symptoms not responding to your albuterol  -nasal saline wash 2-3 times daily (use prepackaged nasal saline or bottled/distilled water if making your own)   -can use AFRIN nasal spray for drainage and nasal congestion - but do NOT use longer then 3-4 days  -can use tylenol (in no history of liver disease) or ibuprofen (if no history of kidney disease, bowel bleeding or significant heart disease) as directed for aches and sorethroat  -if you are taking a cough medication - use only as directed, may also try a teaspoon of honey to coat the throat and throat lozenges.  -for sore throat, salt water gargles can help  -follow up if you have fevers, facial pain, tooth pain, difficulty breathing or are worsening or symptoms persist longer then expected  Upper Respiratory Infection, Adult An upper respiratory infection (URI) is also known as the common cold. It is often caused by a type of germ (virus). Colds are easily spread (contagious). You can  pass it to others by kissing, coughing, sneezing, or drinking out of the same glass. Usually, you get better in 1 to 3  weeks.  However, the cough can last for even longer. HOME CARE   Only take medicine as told by your doctor. Follow instructions provided above.  Drink enough water and fluids to keep your pee (urine) clear or pale yellow.  Get plenty of rest.  Return to work when your temperature is < 100 for 24 hours or as told by your doctor. You may use a face mask and wash your hands to stop your cold from spreading. GET HELP RIGHT AWAY IF:   After the first few days, you feel you are getting worse.  You have questions about your medicine.  You have chills, shortness of breath, or red spit (mucus).  You have pain in the face for more then 1-2 days, especially when you bend forward.  You have a fever, puffy (swollen) neck, pain when you swallow, or white spots in  the back of your throat.  You have a bad headache, ear pain, sinus pain, or chest pain.  You have a high-pitched whistling sound when you breathe in and out (wheezing).  You cough up blood.  You have sore muscles or a stiff neck. MAKE SURE YOU:   Understand these instructions.  Will watch your condition.  Will get help right away if you are not doing well or get worse. Document Released: 04/27/2008 Document Revised: 02/01/2012 Document Reviewed: 02/14/2014 [facility] Patient Information 2015 [place], [place]. This information is not intended to replace advice given to you by your health care provider. Make sure you discuss any questions you have with your health care provider.     Colin Benton R., DO

## 2016-09-27 ENCOUNTER — Encounter (HOSPITAL_COMMUNITY): Payer: Self-pay

## 2017-01-20 ENCOUNTER — Other Ambulatory Visit: Payer: Self-pay | Admitting: Internal Medicine

## 2017-03-24 ENCOUNTER — Ambulatory Visit: Payer: BLUE CROSS/BLUE SHIELD | Admitting: Podiatry

## 2017-04-05 ENCOUNTER — Telehealth: Payer: Self-pay | Admitting: Internal Medicine

## 2017-04-06 ENCOUNTER — Other Ambulatory Visit: Payer: Self-pay | Admitting: Emergency Medicine

## 2017-04-06 MED ORDER — METHYLDOPA 500 MG PO TABS: 1000.0000 mg | ORAL_TABLET | Freq: Two times a day (BID) | ORAL | 0 refills | Status: DC

## 2017-04-06 NOTE — Telephone Encounter (Signed)
lmom for pt to call back

## 2017-04-06 NOTE — Telephone Encounter (Signed)
Sent in a 30 day supply of Aldomet. Pt needs an appointment for an OV for any further refills. Please schedule.

## 2017-04-07 NOTE — Telephone Encounter (Signed)
Pt scheduled on 5/23

## 2017-04-07 NOTE — Progress Notes (Signed)
Chief Complaint  Patient presents with  . Medication Management    HPI: Shirley Alvarez 36 y.o. come in for Chronic disease management  Last med visit  4 2017 for  Ht  She just ran out of her Aldomet yesterday. Taking it thousand milligrams twice a day. Still going for pregnancy periods are regular at this time. Uncertain if it's a side effect of the medicine but over the last few weeks she has had these intermittent electric shock feeling that this down her arms and legs usually with motion but no specific thing hasn't fallen but feels like she might. No specific neck pain and headaches other associations says this is random. Never had this before.  Tobacco  4-8 per day.   bp readings  Are  A bit high   140/ 80 and 100 . At home when she is on medication. Unfortunately she has gained some weight over the last year.  ROS: See pertinent positives and negatives per HPI. No fever chest pain major change in vision hearing weakness numbness.  Past Medical History:  Diagnosis Date  . Allergic rhinitis   . History of ectopic pregnancy    X3   last one 07/ 2015  (received MTX injection)  . Hydrosalpinx    LEFT  . Hypertension   . Pelvic adhesions   . Wears glasses     Family History  Problem Relation Age of Onset  . Liver disease Father   . Asthma Sister   . Asthma Sister   . Anesthesia problems Other     Social History   Social History  . Marital status: Married    Spouse name: N/A  . Number of children: N/A  . Years of education: N/A   Social History Main Topics  . Smoking status: Current Some Day Smoker    Packs/day: 0.25    Years: 10.00    Types: Cigarettes  . Smokeless tobacco: Never Used  . Alcohol use No  . Drug use: No  . Sexual activity: Yes   Other Topics Concern  . None   Social History Narrative   Early Childhood education   Kindergarten age group      History of miscarriage December 2011    Outpatient Medications Prior to Visit    Medication Sig Dispense Refill  . acetaminophen (TYLENOL) 500 MG tablet Take 1,000 mg by mouth 4 (four) times daily as needed for headache.     . albuterol (PROAIR HFA) 108 (90 Base) MCG/ACT inhaler Inhale 2 puffs into the lungs every 4 (four) hours as needed for wheezing or shortness of breath. 1 Inhaler 0  . cetirizine (ZYRTEC) 10 MG tablet Take 1 tablet (10 mg total) by mouth daily. (Patient taking differently: Take 10 mg by mouth as needed. ) 30 tablet 3  . fluticasone (FLONASE) 50 MCG/ACT nasal spray instill 2 sprays into each nostril once daily (Patient taking differently: instill 2 sprays into each nostril as needed) 16 g 2  . ibuprofen (ADVIL,MOTRIN) 800 MG tablet Take 800 mg by mouth 2 (two) times daily as needed (for menstrual pain.). Reported on 03/03/2016    . methyldopa (ALDOMET) 500 MG tablet Take 2 tablets (1,000 mg total) by mouth 2 (two) times daily. 60 tablet 0  . varenicline (CHANTIX STARTING MONTH PAK) 0.5 MG X 11 & 1 MG X 42 tablet Take one 0.5 mg tablet by mouth once daily for 3 days, then increase to one 0.5 mg tablet twice daily for 4  days, then increase to one 1 mg tablet twice daily. (Patient not taking: Reported on 04/08/2017) 53 tablet 1  . benzonatate (TESSALON PERLES) 100 MG capsule Take 1 capsule (100 mg total) by mouth 3 (three) times daily as needed. 20 capsule 0   No facility-administered medications prior to visit.      EXAM:  BP (!) 142/88 (BP Location: Right Arm, Patient Position: Sitting, Cuff Size: Large)   Pulse 91   Temp 98.1 F (36.7 C) (Oral)   Ht 5\' 2"  (1.575 m)   Wt 233 lb (105.7 kg)   LMP 04/07/2017   Breastfeeding? No   BMI 42.62 kg/m   Body mass index is 42.62 kg/m. Blood pressure 150/80 confirmed right arm large cuff GENERAL: vitals reviewed and listed above, alert, oriented, appears well hydrated and in no acute distress HEENT: atraumatic, conjunctiva  clear, no obvious abnormalities on inspection of external nose and ears OP : no  lesion edema or exudate  NECK: no obvious masses on inspection palpation  LUNGS: clear to auscultation bilaterally, no wheezes, rales or rhonchi, good air movement CV: HRRR, no clubbing cyanosis or  peripheral edema nl cap refill  MS: moves all extremities without noticeable focal  Abnormality Neurologic oriented cranial nerves seem intact gait within normal limits no obvious weakness DTRs are present negative Spurling sign. PSYCH: pleasant and cooperative, no obvious depression or anxiety  BP Readings from Last 3 Encounters:  04/08/17 (!) 142/88  07/23/16 140/84  03/03/16 120/78   Wt Readings from Last 3 Encounters:  04/08/17 233 lb (105.7 kg)  07/23/16 224 lb 14.4 oz (102 kg)  03/03/16 225 lb (102.1 kg)     ASSESSMENT AND PLAN:  Discussed the following assessment and plan:  Essential hypertension - Plan: CBC with Differential/Platelet, Comprehensive metabolic panel  Electrical shock sensation  Medication management - Plan: CBC with Differential/Platelet, Comprehensive metabolic panel  Tobacco use disorder  fertility issue  Mild anemia - Plan: CBC with Differential/Platelet, Comprehensive metabolic panel  Severe obesity (BMI >= 40) (HCC) Uncertain cause of the electric shock sensation exam is reassuring will follow-up have her come back in 2 months but if it's getting worse not resolving or progression in any way we may get neurology to see her. We'll add nifedipine extended release 30 mg with the option to increase to 60 if no help after a month consideration of other options. She was on labetalol the past and did not have good blood pressure control at that time. Refilled her Aldomet today. Of course encouraged her to lose weight and stop smoking. She is aware. -Patient advised to return or notify health care team  if  new concerns arise.  Patient Instructions  I do not think that the blood pressure medicine is causing your electric-like symptoms. Your exam is  reassuring but if you are continuing to have these symptoms contact us and we'll get further consult as appropriate. If you're getting weakness falling progression of any symptoms let us know before then. Stay on the methyldopa lab work today will let you know results We'll add nifedapine  long acting to your regimen will have to maybe adjust the dose until we get the correct goal of blood pressure. Healthy weight loss will help your blood pressure control. Let us know how we can help.  ROV in 2 months  Or earleir if needed .      Standley Brooking. Juliano Mceachin M.D.

## 2017-04-08 ENCOUNTER — Encounter: Payer: Self-pay | Admitting: Internal Medicine

## 2017-04-08 ENCOUNTER — Ambulatory Visit (INDEPENDENT_AMBULATORY_CARE_PROVIDER_SITE_OTHER): Payer: BLUE CROSS/BLUE SHIELD | Admitting: Internal Medicine

## 2017-04-08 VITALS — BP 142/88 | HR 91 | Temp 98.1°F | Ht 62.0 in | Wt 233.0 lb

## 2017-04-08 DIAGNOSIS — D649 Anemia, unspecified: Secondary | ICD-10-CM

## 2017-04-08 DIAGNOSIS — Z79899 Other long term (current) drug therapy: Secondary | ICD-10-CM | POA: Diagnosis not present

## 2017-04-08 DIAGNOSIS — I10 Essential (primary) hypertension: Secondary | ICD-10-CM

## 2017-04-08 DIAGNOSIS — F17200 Nicotine dependence, unspecified, uncomplicated: Secondary | ICD-10-CM

## 2017-04-08 DIAGNOSIS — R208 Other disturbances of skin sensation: Secondary | ICD-10-CM | POA: Diagnosis not present

## 2017-04-08 DIAGNOSIS — N979 Female infertility, unspecified: Secondary | ICD-10-CM | POA: Diagnosis not present

## 2017-04-08 LAB — CBC WITH DIFFERENTIAL/PLATELET
BASOS PCT: 0.5 % (ref 0.0–3.0)
Basophils Absolute: 0 10*3/uL (ref 0.0–0.1)
EOS ABS: 0.1 10*3/uL (ref 0.0–0.7)
EOS PCT: 1.2 % (ref 0.0–5.0)
HEMATOCRIT: 36.7 % (ref 36.0–46.0)
Hemoglobin: 12.4 g/dL (ref 12.0–15.0)
Lymphocytes Relative: 21.4 % (ref 12.0–46.0)
Lymphs Abs: 1.6 10*3/uL (ref 0.7–4.0)
MCHC: 33.7 g/dL (ref 30.0–36.0)
MCV: 81.5 fl (ref 78.0–100.0)
MONO ABS: 0.5 10*3/uL (ref 0.1–1.0)
Monocytes Relative: 6.8 % (ref 3.0–12.0)
NEUTROS ABS: 5.4 10*3/uL (ref 1.4–7.7)
Neutrophils Relative %: 70.1 % (ref 43.0–77.0)
PLATELETS: 370 10*3/uL (ref 150.0–400.0)
RBC: 4.51 Mil/uL (ref 3.87–5.11)
RDW: 15.5 % (ref 11.5–15.5)
WBC: 7.6 10*3/uL (ref 4.0–10.5)

## 2017-04-08 LAB — COMPREHENSIVE METABOLIC PANEL
ALT: 15 U/L (ref 0–35)
AST: 13 U/L (ref 0–37)
Albumin: 4.6 g/dL (ref 3.5–5.2)
Alkaline Phosphatase: 79 U/L (ref 39–117)
BUN: 10 mg/dL (ref 6–23)
CALCIUM: 9.7 mg/dL (ref 8.4–10.5)
CHLORIDE: 106 meq/L (ref 96–112)
CO2: 28 meq/L (ref 19–32)
CREATININE: 0.81 mg/dL (ref 0.40–1.20)
GFR: 103.14 mL/min (ref >60.00)
Glucose, Bld: 91 mg/dL (ref 70–99)
POTASSIUM: 4.3 meq/L (ref 3.5–5.1)
SODIUM: 139 meq/L (ref 135–145)
Total Bilirubin: 0.3 mg/dL (ref 0.2–1.2)
Total Protein: 6.9 g/dL (ref 6.0–8.3)

## 2017-04-08 MED ORDER — NIFEDIPINE ER OSMOTIC RELEASE 30 MG PO TB24: 30.0000 mg | ORAL_TABLET | Freq: Every day | ORAL | 1 refills | Status: DC

## 2017-04-08 MED ORDER — METHYLDOPA 500 MG PO TABS: 1000.0000 mg | ORAL_TABLET | Freq: Two times a day (BID) | ORAL | 3 refills | Status: DC

## 2017-04-08 NOTE — Patient Instructions (Addendum)
I do not think that the blood pressure medicine is causing your electric-like symptoms. Your exam is reassuring but if you are continuing to have these symptoms contact us and we'll get further consult as appropriate. If you're getting weakness falling progression of any symptoms let us know before then. Stay on the methyldopa lab work today will let you know results We'll add nifedapine  long acting to your regimen will have to maybe adjust the dose until we get the correct goal of blood pressure. Healthy weight loss will help your blood pressure control. Let us know how we can help.  ROV in 2 months  Or earleir if needed .

## 2017-04-14 ENCOUNTER — Ambulatory Visit: Payer: BLUE CROSS/BLUE SHIELD | Admitting: Podiatry

## 2017-04-14 ENCOUNTER — Ambulatory Visit: Payer: BLUE CROSS/BLUE SHIELD | Admitting: Internal Medicine

## 2017-04-27 ENCOUNTER — Ambulatory Visit: Payer: BLUE CROSS/BLUE SHIELD | Admitting: Podiatry

## 2017-06-08 ENCOUNTER — Ambulatory Visit: Payer: BLUE CROSS/BLUE SHIELD | Admitting: Internal Medicine

## 2017-06-21 NOTE — Progress Notes (Signed)
Chief Complaint  Patient presents with  . Follow-up    HPI: Shirley Alvarez 36 y.o. come in for   bp managememten disease management  Didn't start the medication concern about side effects read    Heart failure  Swelling and sob.  So staying on aldomet   And   Says she can feel with ha when bp is  Up usually in  Afternoon .  No new sx  Tobacco lowering .  Trying  .  Breathing is good.   Down to 5- 10 per day .  Knows has to quit  Didn't take the chantix   peridso  Heavy but   Monthly  ROS: See pertinent positives and negatives per HPI.  Past Medical History:  Diagnosis Date  . Allergic rhinitis   . History of ectopic pregnancy    X3   last one 07/ 2015  (received MTX injection)  . Hydrosalpinx    LEFT  . Hypertension   . Pelvic adhesions   . Wears glasses     Family History  Problem Relation Age of Onset  . Liver disease Father   . Asthma Sister   . Asthma Sister   . Anesthesia problems Other     Social History   Social History  . Marital status: Married    Spouse name: N/A  . Number of children: N/A  . Years of education: N/A   Social History Main Topics  . Smoking status: Current Some Day Smoker    Packs/day: 0.25    Years: 10.00    Types: Cigarettes  . Smokeless tobacco: Never Used  . Alcohol use No  . Drug use: No  . Sexual activity: Yes   Other Topics Concern  . None   Social History Narrative   Early Childhood education   Kindergarten age group      History of miscarriage December 2011    Outpatient Medications Prior to Visit  Medication Sig Dispense Refill  . acetaminophen (TYLENOL) 500 MG tablet Take 1,000 mg by mouth 4 (four) times daily as needed for headache.     . albuterol (PROAIR HFA) 108 (90 Base) MCG/ACT inhaler Inhale 2 puffs into the lungs every 4 (four) hours as needed for wheezing or shortness of breath. 1 Inhaler 0  . cetirizine (ZYRTEC) 10 MG tablet Take 1 tablet (10 mg total) by mouth daily. (Patient taking  differently: Take 10 mg by mouth as needed. ) 30 tablet 3  . fluticasone (FLONASE) 50 MCG/ACT nasal spray instill 2 sprays into each nostril once daily (Patient taking differently: instill 2 sprays into each nostril as needed) 16 g 2  . ibuprofen (ADVIL,MOTRIN) 800 MG tablet Take 800 mg by mouth 2 (two) times daily as needed (for menstrual pain.). Reported on 03/03/2016    . methyldopa (ALDOMET) 500 MG tablet Take 2 tablets (1,000 mg total) by mouth 2 (two) times daily. 120 tablet 3  . varenicline (CHANTIX STARTING MONTH PAK) 0.5 MG X 11 & 1 MG X 42 tablet Take one 0.5 mg tablet by mouth once daily for 3 days, then increase to one 0.5 mg tablet twice daily for 4 days, then increase to one 1 mg tablet twice daily. 53 tablet 1  . NIFEdipine (PROCARDIA-XL/ADALAT-CC/NIFEDICAL-XL) 30 MG 24 hr tablet Take 1 tablet (30 mg total) by mouth daily. Increase to 60 mg per day  as  Directed (Patient not taking: Reported on 06/22/2017) 60 tablet 1   No facility-administered medications prior to  visit.      EXAM:  BP (!) 140/100 (BP Location: Right Arm, Patient Position: Sitting, Cuff Size: Large)   Pulse 79   Temp 98.4 F (36.9 C) (Oral)   Wt 231 lb 12.8 oz (105.1 kg)   BMI 42.40 kg/m   Body mass index is 42.4 kg/m.  GENERAL: vitals reviewed and listed above, alert, oriented, appears well hydrated and in no acute distress HEENT: atraumatic, conjunctiva  clear, no obvious abnormalities on inspection of external nose and ears LUNGS: clear to auscultation bilaterally, no wheezes, rales or rhonchi, good air movement CV: HRRR, no clubbing cyanosis trc  peripheral edema nl cap refill  MS: moves all extremities without noticeable focal  abnormality PSYCH: pleasant and cooperative, no obvious depression or anxiety Skin some  Acanthosis  Type  In neck  Lab Results  Component Value Date   WBC 7.6 04/08/2017   HGB 12.4 04/08/2017   HCT 36.7 04/08/2017   PLT 370.0 04/08/2017   GLUCOSE 91 04/08/2017   CHOL  149 05/28/2015   TRIG 28.0 05/28/2015   HDL 38.70 (L) 05/28/2015   LDLCALC 105 (H) 05/28/2015   ALT 15 04/08/2017   AST 13 04/08/2017   NA 139 04/08/2017   K 4.3 04/08/2017   CL 106 04/08/2017   CREATININE 0.81 04/08/2017   BUN 10 04/08/2017   CO2 28 04/08/2017   TSH 1.71 05/28/2015   HGBA1C 5.4 06/23/2013   BP Readings from Last 3 Encounters:  06/22/17 (!) 140/100  04/08/17 (!) 142/88  07/23/16 140/84   Wt Readings from Last 3 Encounters:  06/22/17 231 lb 12.8 oz (105.1 kg)  04/08/17 233 lb (105.7 kg)  07/23/16 224 lb 14.4 oz (102 kg)   bp confirmed large cuff  Sitting right  ASSESSMENT AND PLAN:  Discussed the following assessment and plan:  Essential hypertension  Tobacco use disorder  Severe obesity (BMI >= 40) (HCC)  fertility issue  Medication management Discussion   About concern today and strongly encouraged her to try the low-dose nifedipine extended release.  Se discussed  She will try the 30 mg XL are. Send  email in a month for blood pressure control in any time concerns about side effects of medicine. Continue the methyldopa otherwise. Discussed smoking cessation good to wean down and then she just needs to quit patient is aware. Also discussed monitoring her blood pressure she has a wrist machine.  -Patient advised to return or notify health care team  if  new concerns arise.  Patient Instructions   Try taking the new medication   nifedipine  xr    At night if concern  About nuiscance side effects .  Sent me a e mail after a month with  bp readings     OR  If you are having any side effects that  Are of concern.  Stay on the methyl dopa .  Continue to stop tobacco efforts.  Wt Readings from Last 3 Encounters:  06/22/17 231 lb 12.8 oz (105.1 kg)  04/08/17 233 lb (105.7 kg)  07/23/16 224 lb 14.4 oz (102 kg)       Avrohom Mckelvin K. Kade Rickels M.D.

## 2017-06-22 ENCOUNTER — Encounter: Payer: Self-pay | Admitting: Internal Medicine

## 2017-06-22 ENCOUNTER — Ambulatory Visit (INDEPENDENT_AMBULATORY_CARE_PROVIDER_SITE_OTHER): Payer: BLUE CROSS/BLUE SHIELD | Admitting: Internal Medicine

## 2017-06-22 VITALS — BP 140/100 | HR 79 | Temp 98.4°F | Wt 231.8 lb

## 2017-06-22 DIAGNOSIS — Z79899 Other long term (current) drug therapy: Secondary | ICD-10-CM

## 2017-06-22 DIAGNOSIS — I10 Essential (primary) hypertension: Secondary | ICD-10-CM

## 2017-06-22 DIAGNOSIS — N979 Female infertility, unspecified: Secondary | ICD-10-CM | POA: Diagnosis not present

## 2017-06-22 DIAGNOSIS — F17200 Nicotine dependence, unspecified, uncomplicated: Secondary | ICD-10-CM

## 2017-06-22 NOTE — Patient Instructions (Signed)
Try taking the new medication   nifedipine  xr    At night if concern  About nuiscance side effects .  Sent me a e mail after a month with  bp readings     OR  If you are having any side effects that  Are of concern.  Stay on the methyl dopa .  Continue to stop tobacco efforts.  Wt Readings from Last 3 Encounters:  06/22/17 231 lb 12.8 oz (105.1 kg)  04/08/17 233 lb (105.7 kg)  07/23/16 224 lb 14.4 oz (102 kg)

## 2017-06-24 ENCOUNTER — Other Ambulatory Visit: Payer: Self-pay | Admitting: Internal Medicine

## 2017-07-08 ENCOUNTER — Encounter (HOSPITAL_COMMUNITY): Payer: Self-pay | Admitting: Emergency Medicine

## 2017-07-08 ENCOUNTER — Emergency Department (HOSPITAL_COMMUNITY)
Admission: EM | Admit: 2017-07-08 | Discharge: 2017-07-08 | Disposition: A | Discharge status: Home / Self Care | Payer: BLUE CROSS/BLUE SHIELD | Attending: Emergency Medicine | Admitting: Emergency Medicine

## 2017-07-08 ENCOUNTER — Emergency Department (HOSPITAL_COMMUNITY): Payer: BLUE CROSS/BLUE SHIELD

## 2017-07-08 DIAGNOSIS — H532 Diplopia: Secondary | ICD-10-CM | POA: Diagnosis not present

## 2017-07-08 DIAGNOSIS — I10 Essential (primary) hypertension: Secondary | ICD-10-CM | POA: Insufficient documentation

## 2017-07-08 DIAGNOSIS — F17210 Nicotine dependence, cigarettes, uncomplicated: Secondary | ICD-10-CM | POA: Insufficient documentation

## 2017-07-08 DIAGNOSIS — R471 Dysarthria and anarthria: Secondary | ICD-10-CM | POA: Diagnosis not present

## 2017-07-08 DIAGNOSIS — Z79899 Other long term (current) drug therapy: Secondary | ICD-10-CM | POA: Diagnosis not present

## 2017-07-08 DIAGNOSIS — R4701 Aphasia: Secondary | ICD-10-CM | POA: Diagnosis present

## 2017-07-08 LAB — DIFFERENTIAL
BASOS ABS: 0 10*3/uL (ref 0.0–0.1)
BASOS PCT: 0 %
EOS ABS: 0.1 10*3/uL (ref 0.0–0.7)
Eosinophils Relative: 1 %
Lymphocytes Relative: 31 %
Lymphs Abs: 2.6 10*3/uL (ref 0.7–4.0)
MONO ABS: 0.6 10*3/uL (ref 0.1–1.0)
Monocytes Relative: 7 %
NEUTROS ABS: 5.1 10*3/uL (ref 1.7–7.7)
Neutrophils Relative %: 61 %

## 2017-07-08 LAB — COMPREHENSIVE METABOLIC PANEL
ALT: 17 U/L (ref 14–54)
AST: 20 U/L (ref 15–41)
Albumin: 4 g/dL (ref 3.5–5.0)
Alkaline Phosphatase: 68 U/L (ref 38–126)
Anion gap: 7 (ref 5–15)
BUN: 8 mg/dL (ref 6–20)
CHLORIDE: 107 mmol/L (ref 101–111)
CO2: 26 mmol/L (ref 22–32)
CREATININE: 0.85 mg/dL (ref 0.44–1.00)
Calcium: 9.3 mg/dL (ref 8.9–10.3)
Glucose, Bld: 95 mg/dL (ref 65–99)
POTASSIUM: 3.9 mmol/L (ref 3.5–5.1)
SODIUM: 140 mmol/L (ref 135–145)
Total Bilirubin: 0.4 mg/dL (ref 0.3–1.2)
Total Protein: 6.6 g/dL (ref 6.5–8.1)

## 2017-07-08 LAB — CBC
HCT: 34 % — ABNORMAL LOW (ref 36.0–46.0)
Hemoglobin: 11.1 g/dL — ABNORMAL LOW (ref 12.0–15.0)
MCH: 26.4 pg (ref 26.0–34.0)
MCHC: 32.6 g/dL (ref 30.0–36.0)
MCV: 80.8 fL (ref 78.0–100.0)
PLATELETS: 375 10*3/uL (ref 150–400)
RBC: 4.21 MIL/uL (ref 3.87–5.11)
RDW: 14.8 % (ref 11.5–15.5)
WBC: 8.4 10*3/uL (ref 4.0–10.5)

## 2017-07-08 LAB — I-STAT CHEM 8, ED
BUN: 9 mg/dL (ref 6–20)
CREATININE: 0.8 mg/dL (ref 0.44–1.00)
Calcium, Ion: 1.22 mmol/L (ref 1.15–1.40)
Chloride: 104 mmol/L (ref 101–111)
Glucose, Bld: 93 mg/dL (ref 65–99)
HEMATOCRIT: 34 % — AB (ref 36.0–46.0)
HEMOGLOBIN: 11.6 g/dL — AB (ref 12.0–15.0)
POTASSIUM: 3.8 mmol/L (ref 3.5–5.1)
SODIUM: 141 mmol/L (ref 135–145)
TCO2: 25 mmol/L (ref 0–100)

## 2017-07-08 LAB — CBG MONITORING, ED: Glucose-Capillary: 99 mg/dL (ref 65–99)

## 2017-07-08 LAB — APTT: APTT: 33 s (ref 24–36)

## 2017-07-08 LAB — I-STAT TROPONIN, ED: TROPONIN I, POC: 0 ng/mL (ref 0.00–0.08)

## 2017-07-08 LAB — PROTIME-INR
INR: 1
Prothrombin Time: 13.2 seconds (ref 11.4–15.2)

## 2017-07-08 NOTE — Discharge Instructions (Signed)
Make sure you are getting plenty of rest.

## 2017-07-08 NOTE — ED Triage Notes (Signed)
Pt sts slurred speech unsure of when started and some blurred vision x 4 days

## 2017-07-08 NOTE — ED Provider Notes (Signed)
[facility] DEPT Provider Note   CSN: [id number] Arrival date & time: 07/08/17  1605     History   Chief Complaint Chief Complaint  Patient presents with  . Aphasia    HPI Shirley Alvarez is a 36 y.o. female.  She was at work today, when her coworkers told her that she had slurred speech.  The patient did not feel that she had slurred speech.  She is here with her partner who states that her speech has not been slurred.  Since being here, the patient had a two-minute episode of double vision, which resolved spontaneously.  She has not had a headache today.  There has been no difficulty speaking.  She has not had trouble walking.  She has not had any scotomas, hearing loss, vision loss, palpitations, chest pain, shortness of breath, or paresthesia.  She saw her PCP 2 weeks ago for a blood pressure check and was given a new blood pressure medication.  There are no other known modifying factors.  HPI  Past Medical History:  Diagnosis Date  . Allergic rhinitis   . History of ectopic pregnancy    X3   last one 07/ 2015  (received MTX injection)  . Hydrosalpinx    LEFT  . Hypertension   . Pelvic adhesions   . Wears glasses     Patient Active Problem List   Diagnosis Date Noted  . Visit for preventive health examination 05/29/2015  . Decreased hearing of left ear 06/23/2013  . Obesity 08/07/2012  . Tobacco user 02/11/2012  . Hypertension 02/11/2012  . Hx of ectopic pregnancy 02/09/2012  . TOBACCO ABUSE 08/11/2010  . 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 FEMALE INFERTILITY 09/20/2009  . IRREGULAR MENSES 02/06/2009  . TINNITUS 03/05/2008  . Essential hypertension 08/05/2007  . ALLERGIC RHINITIS 08/05/2007  . HEADACHE 08/05/2007    Past Surgical History:  Procedure Laterality Date  . CHROMOPERTUBATION Bilateral 11/07/2014   Procedure: CHROMOPERTUBATION;  Surgeon: Governor Specking, MD;  Location: [facility];  Service: Gynecology;  Laterality: Bilateral;    . LAPAROSCOPIC LYSIS OF ADHESIONS N/A 11/07/2014   Procedure: LAPAROSCOPIC LYSIS OF ADHESIONS AND FIMBRIOPLASTY;  Surgeon: Governor Specking, MD;  Location: [facility];  Service: Gynecology;  Laterality: N/A;  . WISDOM TOOTH EXTRACTION      OB History    Gravida Para Term Preterm AB Living   5 0 0 0 1 0   SAB TAB Ectopic Multiple Live Births   1 0 0 0         Home Medications    Prior to Admission medications   Medication Sig Start Date End Date Taking? Authorizing Provider  acetaminophen (TYLENOL) 500 MG tablet Take 1,000 mg by mouth 4 (four) times daily as needed for headache.     [provider]  albuterol (PROAIR HFA) 108 (90 Base) MCG/ACT inhaler Inhale 2 puffs into the lungs every 4 (four) hours as needed for wheezing or shortness of breath. 07/23/16   Lucretia Kern, DO  cetirizine (ZYRTEC) 10 MG tablet Take 1 tablet (10 mg total) by mouth daily. Patient taking differently: Take 10 mg by mouth as needed.  02/19/15   Panosh, Standley Brooking, MD  fluticasone (FLONASE) 50 MCG/ACT nasal spray instill 2 sprays into each nostril once daily Patient taking differently: instill 2 sprays into each nostril as needed 12/13/14   Panosh, Standley Brooking, MD  ibuprofen (ADVIL,MOTRIN) 800 MG tablet Take 800 mg by mouth 2 (two) times daily as  needed (for menstrual pain.). Reported on 03/03/2016    [provider]  methyldopa (ALDOMET) 500 MG tablet Take 2 tablets (1,000 mg total) by mouth 2 (two) times daily. 04/08/17   Panosh, Standley Brooking, MD  NIFEdipine (PROCARDIA-XL/ADALAT-CC/NIFEDICAL-XL) 30 MG 24 hr tablet take 1 tablet by mouth once daily then INCREASE TO 2 TABLETS A DAY AS DIRECTED 06/25/17   Panosh, Standley Brooking, MD  varenicline (CHANTIX STARTING MONTH PAK) 0.5 MG X 11 & 1 MG X 42 tablet Take one 0.5 mg tablet by mouth once daily for 3 days, then increase to one 0.5 mg tablet twice daily for 4 days, then increase to one 1 mg tablet twice daily. 03/03/16   Panosh, Standley Brooking, MD    Family  History Family History  Problem Relation Age of Onset  . Liver disease Father   . Asthma Sister   . Asthma Sister   . Anesthesia problems Other     Social History Social History  Substance Use Topics  . Smoking status: Current Some Day Smoker    Packs/day: 0.25    Years: 10.00    Types: Cigarettes  . Smokeless tobacco: Never Used  . Alcohol use No     Allergies   Patient has no known allergies.   Review of Systems Review of Systems  All other systems reviewed and are negative.    Physical Exam Updated Vital Signs BP 132/81 (BP Location: Right Arm)   Pulse 68   Temp 97.9 F (36.6 C) (Oral)   Resp 14   SpO2 100%   Physical Exam  Constitutional: She is oriented to person, place, and time. She appears well-developed and well-nourished. No distress.  HENT:  Head: Normocephalic and atraumatic.  Eyes: Pupils are equal, round, and reactive to light. Conjunctivae and EOM are normal.  Neck: Normal range of motion and phonation normal. Neck supple.  Cardiovascular: Normal rate and regular rhythm.   Pulmonary/Chest: Effort normal and breath sounds normal. She exhibits no tenderness.  Abdominal: Soft. She exhibits no distension. There is no tenderness. There is no guarding.  Musculoskeletal: Normal range of motion.  Neurological: She is alert and oriented to person, place, and time. She exhibits normal muscle tone.  No dysarthria, aphasia or nystagmus.  Normal finger to nose bilaterally.  Skin: Skin is warm and dry.  Psychiatric: She has a normal mood and affect. Her behavior is normal. Judgment and thought content normal.  Nursing note and vitals reviewed.    ED Treatments / Results  Labs (all labs ordered are listed, but only abnormal results are displayed) Labs Reviewed  CBC - Abnormal; Notable for the following:       Result Value   Hemoglobin 11.1 (*)    HCT 34.0 (*)    All other components within normal limits  I-STAT CHEM 8, ED - Abnormal; Notable for the  following:    Hemoglobin 11.6 (*)    HCT 34.0 (*)    All other components within normal limits  PROTIME-INR  APTT  DIFFERENTIAL  COMPREHENSIVE METABOLIC PANEL  I-STAT TROPONIN, ED  CBG MONITORING, ED    EKG  EKG Interpretation None       Radiology Ct Head Wo Contrast  Result Date: 07/08/2017 CLINICAL DATA:  Blurry vision for 4 days.  Slurred speech. EXAM: CT HEAD WITHOUT CONTRAST TECHNIQUE: Contiguous axial images were obtained from the base of the skull through the vertex without intravenous contrast. COMPARISON:  None. FINDINGS: Brain: No evidence of acute infarction, hemorrhage,  hydrocephalus, extra-axial collection or mass lesion/mass effect. Vascular: No hyperdense vessel or unexpected calcification. Skull: Normal. Negative for fracture or focal lesion. Sinuses/Orbits: The bilateral paranasal sinuses and mastoid air cells are clear. The orbits are unremarkable. Other: None. IMPRESSION: Normal noncontrast head CT. Electronically Signed   By: Titus Dubin M.D.   On: 07/08/2017 17:28    Procedures Procedures (including critical care time)  Medications Ordered in ED Medications - No data to display   Initial Impression / Assessment and Plan / ED Course  I have reviewed the triage vital signs and the nursing notes.  Pertinent labs & imaging results that were available during my care of the patient were reviewed by me and considered in my medical decision making (see chart for details).  Clinical Course as of Jul 08 2130  Thu Jul 08, 2017  2109 Normal CT HEAD WO CONTRAST [EW]    Clinical Course User Index [EW] Daleen Bo, MD     Patient Vitals for the past 24 hrs:  BP Temp Temp src Pulse Resp SpO2  07/08/17 2108 132/81 - - 68 14 100 %  07/08/17 1837 (!) 136/91 - - 74 16 100 %  07/08/17 1610 (!) 146/80 97.9 F (36.6 C) Oral 80 18 96 %    9:31 PM Reevaluation with update and discussion. After initial assessment and treatment, an updated evaluation reveals she  remains comfortable and has no further complaints.  Findings discussed with patient and partner, all questions were answered. Sahiti Joswick L     Final Clinical Impressions(s) / ED Diagnoses   Final diagnoses:  Dysarthria   Reported dysarthria, transient, not sensed by her.  All symptoms resolved.  Patient does not have any worrisome findings.  Nursing Notes Reviewed/ Care Coordinated Applicable Imaging Reviewed Interpretation of Laboratory Data incorporated into ED treatment  The patient appears reasonably screened and/or stabilized for discharge and I doubt any other medical condition or other [facility] requiring further screening, evaluation, or treatment in the ED at this time prior to discharge.  Plan: Home Medications-continue usual medications; Home Treatments-rest, fluids; return here if the recommended treatment, does not improve the symptoms; Recommended follow up-PCP, as needed   New Prescriptions New Prescriptions   No medications on file     Daleen Bo, MD 07/08/17 2135

## 2017-08-13 ENCOUNTER — Encounter: Payer: Self-pay | Admitting: Internal Medicine

## 2017-10-06 ENCOUNTER — Encounter: Payer: Self-pay | Admitting: Podiatry

## 2017-10-06 ENCOUNTER — Ambulatory Visit: Payer: BLUE CROSS/BLUE SHIELD

## 2017-10-06 ENCOUNTER — Ambulatory Visit: Payer: BLUE CROSS/BLUE SHIELD | Admitting: Podiatry

## 2017-10-06 DIAGNOSIS — S99922A Unspecified injury of left foot, initial encounter: Secondary | ICD-10-CM

## 2017-10-06 DIAGNOSIS — R600 Localized edema: Secondary | ICD-10-CM

## 2017-10-06 NOTE — Progress Notes (Signed)
Subjective:    Patient ID: Shirley Alvarez, female   DOB: 36 y.o.   MRN: [id number]   HPI patient stated she turned her left foot and she is worried she may have broken something and she's having trouble bearing weight on it    ROS      Objective:  Physical Exam neurovascular status intact negative Homans sign noted with patient's left foot on the lateral side fifth metatarsal base showing inflammation and irritation with pain upon palpation. Patient is noted to have good digital perfusion well oriented 3     Assessment:   Possibility for fracture base of fifth metatarsal left      Plan:    H&P x-rays performed and discussed compression ice therapy and reduced activity and it should get better in the next couple weeks. Placed on diclofenac 75 mg twice a day  X-rays were negative for signs of fracture appears to be soft tissue injury

## 2017-11-16 ENCOUNTER — Other Ambulatory Visit: Payer: Self-pay | Admitting: Internal Medicine

## 2017-12-19 ENCOUNTER — Other Ambulatory Visit: Payer: Self-pay | Admitting: Internal Medicine

## 2018-02-22 ENCOUNTER — Encounter: Payer: Self-pay | Admitting: Internal Medicine

## 2018-02-22 ENCOUNTER — Ambulatory Visit: Payer: BLUE CROSS/BLUE SHIELD | Admitting: Internal Medicine

## 2018-02-22 VITALS — BP 126/74 | HR 74 | Temp 97.8°F | Wt 235.4 lb

## 2018-02-22 DIAGNOSIS — I10 Essential (primary) hypertension: Secondary | ICD-10-CM | POA: Diagnosis not present

## 2018-02-22 DIAGNOSIS — Z79899 Other long term (current) drug therapy: Secondary | ICD-10-CM | POA: Diagnosis not present

## 2018-02-22 DIAGNOSIS — J069 Acute upper respiratory infection, unspecified: Secondary | ICD-10-CM

## 2018-02-22 DIAGNOSIS — F17200 Nicotine dependence, unspecified, uncomplicated: Secondary | ICD-10-CM | POA: Diagnosis not present

## 2018-02-22 MED ORDER — METHYLDOPA 500 MG PO TABS: 1000.0000 mg | ORAL_TABLET | Freq: Two times a day (BID) | ORAL | 3 refills | Status: DC

## 2018-02-22 NOTE — Progress Notes (Signed)
Chief Complaint  Patient presents with  . 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 come in for acute visit sore thraot achy congestion cough as above  . No fever  Albuterol helps  With wheezing   Upper congestion and  Allergy some   thorat hursts  Mid trachea area .   bp :doing better needs refill of aldomet .   No new sx with this   No fever.   ROS: See pertinent positives and negatives per HPI. No fever  Hemoptysis     Past Medical History:  Diagnosis Date  . Allergic rhinitis   . History of ectopic pregnancy    X3   last one 07/ 2015  (received MTX injection)  . Hydrosalpinx    LEFT  . Hypertension   . Pelvic adhesions   . Wears glasses     Family History  Problem Relation Age of Onset  . Liver disease Father   . Asthma Sister   . Asthma Sister   . Anesthesia problems Other     Social History   Socioeconomic History  . Marital status: Married    Spouse name: Not on file  . Number of children: Not on file  . Years of education: Not on file  . Highest education level: Not on file  Occupational History  . Not on file  Social Needs  . Financial resource strain: Not on file  . Food insecurity:    Worry: Not on file    Inability: Not on file  . Transportation needs:    Medical: Not on file    Non-medical: Not on file  Tobacco Use  . Smoking status: Current Some Day Smoker    Packs/day: 0.25    Years: 10.00    Pack years: 2.50    Types: Cigarettes  . Smokeless tobacco: Never Used  Substance and Sexual Activity  . Alcohol use: No    Alcohol/week: 0.0 oz  . Drug use: No  . Sexual activity: Yes  Lifestyle  . Physical activity:    Days per week: Not on file    Minutes per session: Not on file  . Stress: Not on file    Relationships  . Social connections:    Talks on phone: Not on file    Gets together: Not on file    Attends religious service: Not on file    Active member of club or organization: Not on file    Attends meetings of clubs or organizations: Not on file    Relationship status: Not on file  Other Topics Concern  . 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 acetaminophen (TYLENOL) 500 MG tablet Take 1,000 mg by mouth 4 (four) times daily as needed for headache.     . albuterol (PROAIR HFA) 108 (90 Base) MCG/ACT inhaler Inhale 2 puffs into the lungs every 4 (four) hours as needed for wheezing or shortness of breath. 1 Inhaler 0  . cetirizine (ZYRTEC) 10 MG tablet Take 1 tablet (10 mg total) by mouth daily. (Patient  taking differently: Take 10 mg by mouth as needed. ) 30 tablet 3  . fluticasone (FLONASE) 50 MCG/ACT nasal spray instill 2 sprays into each nostril once daily (Patient taking differently: instill 2 sprays into each nostril as needed) 16 g 2  . ibuprofen (ADVIL,MOTRIN) 800 MG tablet Take 800 mg by mouth 2 (two) times daily as needed (for menstrual pain.). Reported on 03/03/2016    . NIFEdipine (PROCARDIA-XL/ADALAT-CC/NIFEDICAL-XL) 30 MG 24 hr tablet take 1 tablet by mouth once daily then INCREASE TO 2 TABLETS A DAY AS DIRECTED 60 tablet 1  . varenicline (CHANTIX STARTING MONTH PAK) 0.5 MG X 11 & 1 MG X 42 tablet Take one 0.5 mg tablet by mouth once daily for 3 days, then increase to one 0.5 mg tablet twice daily for 4 days, then increase to one 1 mg tablet twice daily. 53 tablet 1  . methyldopa (ALDOMET) 500 MG tablet take 2 tablets by mouth twice a day 120 tablet 0   No facility-administered medications prior to visit.      EXAM:  BP 126/74 (BP Location: Right Arm, Patient Position: Sitting, Cuff Size: Large)    Pulse 74   Temp 97.8 F (36.6 C) (Oral)   Wt 235 lb 6.4 oz (106.8 kg)   BMI 43.06 kg/m   Body mass index is 43.06 kg/m. WDWN in NAD  quiet respirations; mildly congested  somewhat hoarse. Non toxic . But sick  HEENT: Normocephalic ;atraumatic , Eyes;  PERRL, EOMs  Full, lids and conjunctiva clear,,Ears: no deformities, canals nl, TM landmarks normal, Nose: no deformity or discharge but congested;face non  tender Mouth : OP clear without lesion or edema .  Mild erythema  No lesion noted  Difficult exam  Gag reflex but  adequyate exam  Neck: Supple without adenopathy or masses or bruits Chest:  Clear to A&P without wheezes rales or rhonchi but dec bs  No retractions  CV:  S1-S2 no gallops or murmurs peripheral perfusion is normal Skin :nl perfusion and no acute rashes    Lab Results  Component Value Date   WBC 8.4 07/08/2017   HGB 11.6 (L) 07/08/2017   HCT 34.0 (L) 07/08/2017   PLT 375 07/08/2017   GLUCOSE 93 07/08/2017   CHOL 149 05/28/2015   TRIG 28.0 05/28/2015   HDL 38.70 (L) 05/28/2015   LDLCALC 105 (H) 05/28/2015   ALT 17 07/08/2017   AST 20 07/08/2017   NA 141 07/08/2017   K 3.8 07/08/2017   CL 104 07/08/2017   CREATININE 0.80 07/08/2017   BUN 9 07/08/2017   CO2 26 07/08/2017   TSH 1.71 05/28/2015   INR 1.00 07/08/2017   HGBA1C 5.4 06/23/2013   BP Readings from Last 3 Encounters:  02/22/18 126/74  07/08/17 126/84  06/22/17 (!) 140/100    ASSESSMENT AND PLAN:  Discussed the following assessment and plan:  Acute upper respiratory infection of multiple sites - appears viral hx of wheexing  expectant managment   Essential hypertension - says meds working and needs srefill aldomet   Medication management  Tobacco use disorder - cutting back  Plan rov about med check in July 19   -Patient advised to return or notify health care team  if  new concerns arise.  Patient Instructions  This is a viral respiratory infection  And should run its course   But your  cough could last 14 days   But should be feeling better  After the weekend.   Use inhaler as  needed for wheezing.  No tobacco  Can try mucinex  Or mucinex dm  Generic to loosen up the mucous. And helps x  Gargles tylenol / ibuprofen and rest until feeling better .   will refill the aldomet  If getting   hihg fever or   More uncontrolled wheezing or sever pain  Then contact medical team  for reevaluation        Standley Brooking. Elroy Schembri M.D.

## 2018-02-22 NOTE — Patient Instructions (Addendum)
This is a viral respiratory infection  And should run its course   But your cough could last 14 days   But should be feeling better  After the weekend.   Use inhaler as needed for wheezing.  No tobacco  Can try mucinex  Or mucinex dm  Generic to loosen up the mucous. And helps x  Gargles tylenol / ibuprofen and rest until feeling better .   will refill the aldomet  If getting   hihg fever or   More uncontrolled wheezing or sever pain  Then contact medical team  for reevaluation

## 2018-05-23 NOTE — Progress Notes (Signed)
Chief Complaint  Patient presents with  . Medication Management  . Follow-up    HPI: Shirley Alvarez 37 y.o. come in for Chronic disease management     Also has left ankle swelling and discomfor at times left ankles comes and goes swelling not up the leg and did have an injury after the onset no swelling of the whole leg or history of DVT.  Bothers her sometimes when her legs are down.  Asks if it could be gout.   Readings about  115/120 over 15-72 diastolic.  She is taking methyldopa 2 in the evening not twice daily and then taking 1 nifedipine.  She denies any significant side effects feels like her blood pressures controlled.  Tobacco   Goal .   Lot aless.   4- 10 .    Still trying to quit.  And also trying for pregnancy but no active fertility treatment.  Has not tried Chantix yet.  ROS: See pertinent positives and negatives per HPI.  Past Medical History:  Diagnosis Date  . Allergic rhinitis   . History of ectopic pregnancy    X3   last one 07/ 2015  (received MTX injection)  . Hydrosalpinx    LEFT  . Hypertension   . Pelvic adhesions   . Wears glasses     Family History  Problem Relation Age of Onset  . Liver disease Father   . Asthma Sister   . Asthma Sister   . Anesthesia problems Other     Social History   Socioeconomic History  . Marital status: Married    Spouse name: Not on file  . Number of children: Not on file  . Years of education: Not on file  . Highest education level: Not on file  Occupational History  . Not on file  Social Needs  . Financial resource strain: Not on file  . Food insecurity:    Worry: Not on file    Inability: Not on file  . Transportation needs:    Medical: Not on file    Non-medical: Not on file  Tobacco Use  . Smoking status: Current Some Day Smoker    Packs/day: 0.25    Years: 10.00    Pack years: 2.50    Types: Cigarettes  . Smokeless tobacco: Never Used  Substance and Sexual Activity  . Alcohol use:  No    Alcohol/week: 0.0 oz  . Drug use: No  . Sexual activity: Yes  Lifestyle  . Physical activity:    Days per week: Not on file    Minutes per session: Not on file  . Stress: Not on file  Relationships  . Social connections:    Talks on phone: Not on file    Gets together: Not on file    Attends religious service: Not on file    Active member of club or organization: Not on file    Attends meetings of clubs or organizations: Not on file    Relationship status: Not on file  Other Topics Concern  . 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 acetaminophen (TYLENOL) 500 MG tablet Take 1,000 mg by mouth 4 (four) times daily as needed for headache.     . albuterol (PROAIR HFA) 108 (90 Base) MCG/ACT inhaler Inhale 2  puffs into the lungs every 4 (four) hours as needed for wheezing or shortness of breath. 1 Inhaler 0  . cetirizine (ZYRTEC) 10 MG tablet Take 1 tablet (10 mg total) by mouth daily. (Patient taking differently: Take 10 mg by mouth as needed. ) 30 tablet 3  . fluticasone (FLONASE) 50 MCG/ACT nasal spray instill 2 sprays into each nostril once daily (Patient taking differently: instill 2 sprays into each nostril as needed) 16 g 2  . ibuprofen (ADVIL,MOTRIN) 800 MG tablet Take 800 mg by mouth 2 (two) times daily as needed (for menstrual pain.). Reported on 03/03/2016    . methyldopa (ALDOMET) 500 MG tablet Take 2 tablets (1,000 mg total) by mouth 2 (two) times daily. 120 tablet 3  . NIFEdipine (PROCARDIA-XL/ADALAT-CC/NIFEDICAL-XL) 30 MG 24 hr tablet take 1 tablet by mouth once daily then INCREASE TO 2 TABLETS A DAY AS DIRECTED (Patient taking differently: take 1 tablet by mouth once daily) 60 tablet 1  . varenicline (CHANTIX STARTING MONTH PAK) 0.5 MG X 11 & 1 MG X 42 tablet Take one 0.5 mg tablet by mouth once daily  for 3 days, then increase to one 0.5 mg tablet twice daily for 4 days, then increase to one 1 mg tablet twice daily. (Patient not taking: Reported on 05/24/2018) 53 tablet 1   No facility-administered medications prior to visit.      EXAM:  BP 110/85   Pulse 82   Temp 98.2 F (36.8 C)   Wt 233 lb (105.7 kg)   BMI 42.62 kg/m   Body mass index is 42.62 kg/m.  GENERAL: vitals reviewed and listed above, alert, oriented, appears well hydrated and in no acute distress HEENT: atraumatic, conjunctiva  clear, no obvious abnormalities on inspection of external nose and ears  NECK: no obvious masses on inspection palpation  LUNGS: clear to auscultation bilaterally, no wheezes, rales or rhonchi, good air movement CV: HRRR, no clubbing cyanosis or  peripheral edema nl cap refill  Abdomen soft without organomegaly guarding or rebound. MS: moves all extremities without noticeable focal  abnormality left ankle no bony tenderness but slightly more swollen than the right no full leg edema cords or redness. PSYCH: pleasant and cooperative, no obvious depression or anxiety Lab Results  Component Value Date   WBC 8.2 05/24/2018   HGB 12.2 05/24/2018   HCT 37.0 05/24/2018   PLT 411.0 (H) 05/24/2018   GLUCOSE 83 05/24/2018   CHOL 163 05/24/2018   TRIG 44.0 05/24/2018   HDL 45.80 05/24/2018   LDLCALC 108 (H) 05/24/2018   ALT 13 05/24/2018   AST 12 05/24/2018   NA 139 05/24/2018   K 4.2 05/24/2018   CL 105 05/24/2018   CREATININE 0.73 05/24/2018   BUN 8 05/24/2018   CO2 26 05/24/2018   TSH 2.29 05/24/2018   INR 1.00 07/08/2017   HGBA1C 5.4 06/23/2013   BP Readings from Last 3 Encounters:  05/24/18 110/85  02/22/18 126/74  07/08/17 126/84    ASSESSMENT AND PLAN:  Discussed the following assessment and plan:  Essential hypertension - Plan: Basic metabolic panel, CBC with Differential/Platelet, Hepatic function panel, Lipid panel, TSH  Medication management - Plan: Basic metabolic  panel, CBC with Differential/Platelet, Hepatic function panel, Lipid panel, TSH  Swelling of ankle joint, left - Plan: Basic metabolic panel, CBC with Differential/Platelet, Hepatic function panel, Lipid panel, TSH, Ambulatory referral to Sports Medicine  Tobacco user Yearly lab monitoring to be done today I am suspecting her left ankle swelling is  related to mechanical problem question chronic strain versus other no evidence of DVT today options discussed will refer for opinion to sports medicine.  Counseling is appropriate. Total visit 65mins > 50% spent counseling and coordinating care as indicated in above note and in instructions to patient .  -Patient advised to return or notify health care team  if  new concerns arise.  Patient Instructions  Stay on same medications for now.   I dont think the medicine is causing the left ankle swellin g.  Will be contacted about  Sports medicine referral appt. To assess left ankle foot swelling .   Continue working on tobacco cessation .    Separate triggers and positive   from the act of smoking.   If lab ok then  rov  in 6 months -12 months       Elener Custodio K. Binyomin Brann M.D.

## 2018-05-24 ENCOUNTER — Encounter: Payer: Self-pay | Admitting: Internal Medicine

## 2018-05-24 ENCOUNTER — Encounter (INDEPENDENT_AMBULATORY_CARE_PROVIDER_SITE_OTHER): Payer: Self-pay

## 2018-05-24 ENCOUNTER — Ambulatory Visit: Payer: BLUE CROSS/BLUE SHIELD | Admitting: Internal Medicine

## 2018-05-24 VITALS — BP 110/85 | HR 82 | Temp 98.2°F | Wt 233.0 lb

## 2018-05-24 DIAGNOSIS — Z79899 Other long term (current) drug therapy: Secondary | ICD-10-CM | POA: Diagnosis not present

## 2018-05-24 DIAGNOSIS — Z720 Tobacco use: Secondary | ICD-10-CM

## 2018-05-24 DIAGNOSIS — I10 Essential (primary) hypertension: Secondary | ICD-10-CM | POA: Diagnosis not present

## 2018-05-24 DIAGNOSIS — M25472 Effusion, left ankle: Secondary | ICD-10-CM | POA: Diagnosis not present

## 2018-05-24 LAB — CBC WITH DIFFERENTIAL/PLATELET
Basophils Absolute: 0 10*3/uL (ref 0.0–0.1)
Basophils Relative: 0.4 % (ref 0.0–3.0)
EOS PCT: 1.7 % (ref 0.0–5.0)
Eosinophils Absolute: 0.1 10*3/uL (ref 0.0–0.7)
HCT: 37 % (ref 36.0–46.0)
Hemoglobin: 12.2 g/dL (ref 12.0–15.0)
Lymphocytes Relative: 28.3 % (ref 12.0–46.0)
Lymphs Abs: 2.3 10*3/uL (ref 0.7–4.0)
MCHC: 33 g/dL (ref 30.0–36.0)
MCV: 80.4 fl (ref 78.0–100.0)
MONO ABS: 0.6 10*3/uL (ref 0.1–1.0)
Monocytes Relative: 7.7 % (ref 3.0–12.0)
NEUTROS PCT: 61.9 % (ref 43.0–77.0)
Neutro Abs: 5.1 10*3/uL (ref 1.4–7.7)
PLATELETS: 411 10*3/uL — AB (ref 150.0–400.0)
RBC: 4.61 Mil/uL (ref 3.87–5.11)
RDW: 16 % — AB (ref 11.5–15.5)
WBC: 8.2 10*3/uL (ref 4.0–10.5)

## 2018-05-24 LAB — BASIC METABOLIC PANEL
BUN: 8 mg/dL (ref 6–23)
CO2: 26 mEq/L (ref 19–32)
Calcium: 9.4 mg/dL (ref 8.4–10.5)
Chloride: 105 mEq/L (ref 96–112)
Creatinine, Ser: 0.73 mg/dL (ref 0.40–1.20)
GFR: 115.56 mL/min (ref >60.00)
Glucose, Bld: 83 mg/dL (ref 70–99)
POTASSIUM: 4.2 meq/L (ref 3.5–5.1)
Sodium: 139 mEq/L (ref 135–145)

## 2018-05-24 LAB — LIPID PANEL
CHOL/HDL RATIO: 4
Cholesterol: 163 mg/dL (ref 0–200)
HDL: 45.8 mg/dL (ref >39.00)
LDL CALC: 108 mg/dL — AB (ref 0–99)
NonHDL: 117.14
TRIGLYCERIDES: 44 mg/dL (ref 0.0–149.0)
VLDL: 8.8 mg/dL (ref 0.0–40.0)

## 2018-05-24 LAB — HEPATIC FUNCTION PANEL
ALT: 13 U/L (ref 0–35)
AST: 12 U/L (ref 0–37)
Albumin: 4.4 g/dL (ref 3.5–5.2)
Alkaline Phosphatase: 79 U/L (ref 39–117)
BILIRUBIN DIRECT: 0.1 mg/dL (ref 0.0–0.3)
BILIRUBIN TOTAL: 0.3 mg/dL (ref 0.2–1.2)
Total Protein: 6.7 g/dL (ref 6.0–8.3)

## 2018-05-24 LAB — TSH: TSH: 2.29 u[IU]/mL (ref 0.35–4.50)

## 2018-05-24 MED ORDER — NIFEDIPINE ER OSMOTIC RELEASE 30 MG PO TB24: 30.0000 mg | ORAL_TABLET | Freq: Every day | ORAL | 1 refills | Status: DC

## 2018-05-24 NOTE — Patient Instructions (Signed)
Stay on same medications for now.   I dont think the medicine is causing the left ankle swellin g.  Will be contacted about  Sports medicine referral appt. To assess left ankle foot swelling .   Continue working on tobacco cessation .    Separate triggers and positive   from the act of smoking.   If lab ok then  rov  in 6 months -12 months

## 2018-05-25 ENCOUNTER — Other Ambulatory Visit: Payer: Self-pay | Admitting: Internal Medicine

## 2018-06-18 NOTE — Progress Notes (Signed)
[facility] [address] [place], [place] [address] Phone: [phone] Subjective:    I'm seeing this patient by the request  of:  Panosh, Standley Brooking, MD   CC: ankle pain and swelling   HBZ:[id number]  Shirley Alvarez is a 37 y.o. female coming in with complaint of left leg pain. Pain radiates up the leg. Numbness and tingling sometimes. Pain keeps her up at night. Also has low back pain as well.   Onset- Months  Location- Usually the ankle  Character- Sharp, dull, achy  Aggravating factors- Sitting too long Reliving factors- Tylenol  Severity-8 out of 10     Past Medical History:  Diagnosis Date  . Allergic rhinitis   . History of ectopic pregnancy    X3   last one 07/ 2015  (received MTX injection)  . Hydrosalpinx    LEFT  . Hypertension   . Pelvic adhesions   . Wears glasses    Past Surgical History:  Procedure Laterality Date  . CHROMOPERTUBATION Bilateral 11/07/2014   Procedure: CHROMOPERTUBATION;  Surgeon: Governor Specking, MD;  Location: [facility];  Service: Gynecology;  Laterality: Bilateral;  . LAPAROSCOPIC LYSIS OF ADHESIONS N/A 11/07/2014   Procedure: LAPAROSCOPIC LYSIS OF ADHESIONS AND FIMBRIOPLASTY;  Surgeon: Governor Specking, MD;  Location: [facility];  Service: Gynecology;  Laterality: N/A;  . WISDOM TOOTH EXTRACTION     Social History   Socioeconomic History  . Marital status: Married    Spouse name: Not on file  . Number of children: Not on file  . Years of education: Not on file  . Highest education level: Not on file  Occupational History  . Not on file  Social Needs  . Financial resource strain: Not on file  . Food insecurity:    Worry: Not on file    Inability: Not on file  . Transportation needs:    Medical: Not on file    Non-medical: Not on file  Tobacco Use  . Smoking status: Current Some Day Smoker    Packs/day: 0.25    Years: 10.00    Pack years: 2.50    Types:  Cigarettes  . Smokeless tobacco: Never Used  Substance and Sexual Activity  . Alcohol use: No    Alcohol/week: 0.0 oz  . Drug use: No  . Sexual activity: Yes  Lifestyle  . Physical activity:    Days per week: Not on file    Minutes per session: Not on file  . Stress: Not on file  Relationships  . Social connections:    Talks on phone: Not on file    Gets together: Not on file    Attends religious service: Not on file    Active member of club or organization: Not on file    Attends meetings of clubs or organizations: Not on file    Relationship status: Not on file  Other Topics Concern  . Not on file  Social History Narrative   Early Childhood education   Kindergarten age group      History of miscarriage December 2011   No Known Allergies Family History  Problem Relation Age of Onset  . Liver disease Father   . Asthma Sister   . Asthma Sister   . Anesthesia problems Other      Past medical history, social, surgical and family history all reviewed in electronic medical record.  No pertanent information unless stated regarding to the chief complaint.  Review of Systems:Review of systems updated and as accurate as of 06/20/18  No headache, visual changes, nausea, vomiting, diarrhea, constipation, dizziness, abdominal pain, skin rash, fevers, chills, night sweats, weight loss, swollen lymph nodes, body aches, joint swelling, muscle aches, chest pain, shortness of breath, mood changes.   Objective  Blood pressure 100/84, pulse 88, height 5\' 2"  (1.575 m), weight 234 lb (106.1 kg), SpO2 99 %. Systems examined below as of 06/20/18   General: No apparent distress alert and oriented x3 mood and affect normal, dressed appropriately.  HEENT: Pupils equal, extraocular movements intact  Respiratory: Patient's speak in full sentences and does not appear short of breath  Cardiovascular: No lower extremity edema, non tender, no erythema  Skin: Warm dry intact with no signs of  infection or rash on extremities or on axial skeleton.  Abdomen: Soft nontender overweight Neuro: Cranial nerves II through XII are intact, neurovascularly intact in all extremities with 2+ DTRs and 2+ pulses.  Lymph: No lymphadenopathy of posterior or anterior cervical chain or axillae bilaterally.  Gait normal with good balance and coordination.  MSK:  Non tender with full range of motion and good stability and symmetric strength and tone of shoulders, elbows, wrist, hip, knee bilaterally.   Ankle: Left No visible erythema or swelling. Range of motion is full in all directions. Strength is 5/5 in all directions. Stable lateral and medial ligaments; squeeze test and kleiger test unremarkable; Talar dome nontender; No pain at base of 5th MT; No tenderness over cuboid; No tenderness over N spot or navicular prominence No tenderness on posterior aspects of lateral and medial malleolus No sign of peroneal tendon subluxations or tenderness to palpation Negative tarsal tunnel tinel's Moderate to severe tenderness to palpation in the calf on the left side compared to the contralateral side.  Back exam shows loss of lordosis.  Tightness with hamstrings with straight leg test.  Negative Faber test.  Strength in left leg is symmetric compared to the contralateral side.  Deep tendon reflexes intact able to walk 4 steps.  [id number]; 15 additional minutes spent for Therapeutic exercises as stated in above notes.  This included exercises focusing on stretching, strengthening, with significant focus on eccentric aspects.   Long term goals include an improvement in range of motion, strength, endurance as well as avoiding reinjury. Patient's frequency would include in 1-2 times a day, 3-5 times a week for a duration of 6-12 weeks.Low back exercises that included:  Pelvic tilt/bracing instruction to focus on control of the pelvic girdle and lower abdominal muscles  Glute strengthening exercises, focusing on  proper firing of the glutes without engaging the low back muscles Proper stretching techniques for maximum relief for the hamstrings, hip flexors, low back and some rotation where tolerated    Proper technique shown and discussed handout in great detail with ATC.  All questions were discussed and answered.     Impression and Recommendations:     This case required medical decision making of moderate complexity.      Note: This dictation was prepared with Dragon dictation along with smaller phrase technology. Any transcriptional errors that result from this process are unintentional.

## 2018-06-20 ENCOUNTER — Other Ambulatory Visit: Payer: Self-pay | Admitting: Family Medicine

## 2018-06-20 ENCOUNTER — Encounter: Payer: Self-pay | Admitting: Family Medicine

## 2018-06-20 ENCOUNTER — Ambulatory Visit: Payer: BLUE CROSS/BLUE SHIELD | Admitting: Family Medicine

## 2018-06-20 ENCOUNTER — Ambulatory Visit
Admission: RE | Admit: 2018-06-20 | Discharge: 2018-06-20 | Disposition: A | Discharge status: Home / Self Care | Payer: BLUE CROSS/BLUE SHIELD | Source: Ambulatory Visit | Attending: Family Medicine | Admitting: Family Medicine

## 2018-06-20 ENCOUNTER — Ambulatory Visit (HOSPITAL_COMMUNITY)
Admission: RE | Admit: 2018-06-20 | Discharge: 2018-06-20 | Disposition: A | Discharge status: Home / Self Care | Payer: BLUE CROSS/BLUE SHIELD | Source: Ambulatory Visit | Attending: Internal Medicine | Admitting: Internal Medicine

## 2018-06-20 VITALS — BP 100/84 | HR 88 | Ht 62.0 in | Wt 234.0 lb

## 2018-06-20 DIAGNOSIS — M545 Low back pain: Principal | ICD-10-CM

## 2018-06-20 DIAGNOSIS — M25572 Pain in left ankle and joints of left foot: Secondary | ICD-10-CM

## 2018-06-20 DIAGNOSIS — G8929 Other chronic pain: Secondary | ICD-10-CM | POA: Insufficient documentation

## 2018-06-20 DIAGNOSIS — M25472 Effusion, left ankle: Secondary | ICD-10-CM | POA: Diagnosis present

## 2018-06-20 DIAGNOSIS — M79605 Pain in left leg: Secondary | ICD-10-CM | POA: Diagnosis not present

## 2018-06-20 MED ORDER — GABAPENTIN 100 MG PO CAPS: 100.0000 mg | ORAL_CAPSULE | Freq: Every day | ORAL | 3 refills | Status: DC

## 2018-06-20 MED ORDER — DICLOFENAC SODIUM 2 % TD SOLN: 2.0000 g | Freq: Two times a day (BID) | TRANSDERMAL | 3 refills | Status: DC

## 2018-06-20 NOTE — Patient Instructions (Addendum)
Good to see you  Ice 20 minutes 2 times daily. Usually after activity and before bed. pennsaid pinkie amount topically 2 times daily as needed.   Exercises 3 times a week.   Xray downstairs today of the back  Doppler to rule out blood clot. They will call you  Gabapentin 100mg  at night but if you get pregnant limit use.  Vitamin D 2000 IU daily  See me again in 4 weeks

## 2018-06-20 NOTE — Assessment & Plan Note (Signed)
Left leg pain.  Discussed with patient in great length.  Discussed icing regimen, home exercises, we discussed that the differential is quite broad at this time.  Patient has good range of motion of the ankle the patient does have tenderness to compression of the calf and we will rule out deep venous thrombosis.  Patient swelling seems to be improving at this time.  Patient also has some associated back pain.  Seems to have worsening leg pain with back pain seems to be worsening.  X-rays ordered today.  Home exercises given for the back of the leg otherwise if all other testing is normal.  Discussed different medications gabapentin given and warned of potential side effects.  Patient is considering getting pregnant and we discussed discontinuing medications that time.  Follow-up with me again in 4 weeks

## 2018-07-22 LAB — OB RESULTS CONSOLE ABO/RH: RH Type: POSITIVE

## 2018-07-22 LAB — OB RESULTS CONSOLE HIV ANTIBODY (ROUTINE TESTING): HIV: NONREACTIVE

## 2018-07-22 LAB — OB RESULTS CONSOLE GC/CHLAMYDIA
CHLAMYDIA, DNA PROBE: NEGATIVE
GC PROBE AMP, GENITAL: NEGATIVE

## 2018-07-22 LAB — OB RESULTS CONSOLE RUBELLA ANTIBODY, IGM: RUBELLA: IMMUNE

## 2018-07-22 LAB — OB RESULTS CONSOLE HEPATITIS B SURFACE ANTIGEN: Hepatitis B Surface Ag: NEGATIVE

## 2018-07-22 LAB — OB RESULTS CONSOLE RPR: RPR: NONREACTIVE

## 2018-07-22 LAB — OB RESULTS CONSOLE ANTIBODY SCREEN: Antibody Screen: NEGATIVE

## 2018-11-08 ENCOUNTER — Other Ambulatory Visit: Payer: Self-pay | Admitting: Obstetrics and Gynecology

## 2018-11-08 ENCOUNTER — Encounter (HOSPITAL_COMMUNITY): Payer: Self-pay

## 2018-11-08 ENCOUNTER — Ambulatory Visit (HOSPITAL_COMMUNITY)
Admission: RE | Admit: 2018-11-08 | Discharge: 2018-11-08 | Disposition: A | Discharge status: Home / Self Care | Payer: BLUE CROSS/BLUE SHIELD | Source: Ambulatory Visit | Attending: Obstetrics and Gynecology | Admitting: Obstetrics and Gynecology

## 2018-11-08 DIAGNOSIS — O10912 Unspecified pre-existing hypertension complicating pregnancy, second trimester: Secondary | ICD-10-CM

## 2018-11-08 DIAGNOSIS — Z363 Encounter for antenatal screening for malformations: Secondary | ICD-10-CM

## 2018-11-08 DIAGNOSIS — O10012 Pre-existing essential hypertension complicating pregnancy, second trimester: Secondary | ICD-10-CM | POA: Insufficient documentation

## 2018-11-08 DIAGNOSIS — O368120 Decreased fetal movements, second trimester, not applicable or unspecified: Secondary | ICD-10-CM | POA: Diagnosis not present

## 2018-11-08 DIAGNOSIS — O09522 Supervision of elderly multigravida, second trimester: Secondary | ICD-10-CM

## 2018-11-08 DIAGNOSIS — O10919 Unspecified pre-existing hypertension complicating pregnancy, unspecified trimester: Secondary | ICD-10-CM

## 2018-11-08 DIAGNOSIS — O99212 Obesity complicating pregnancy, second trimester: Secondary | ICD-10-CM

## 2018-11-08 DIAGNOSIS — Z3A24 24 weeks gestation of pregnancy: Secondary | ICD-10-CM | POA: Diagnosis not present

## 2018-11-09 ENCOUNTER — Other Ambulatory Visit (HOSPITAL_COMMUNITY): Payer: Self-pay | Admitting: *Deleted

## 2018-11-09 DIAGNOSIS — O409XX0 Polyhydramnios, unspecified trimester, not applicable or unspecified: Secondary | ICD-10-CM

## 2018-12-06 ENCOUNTER — Ambulatory Visit (HOSPITAL_COMMUNITY): Admission: RE | Admit: 2018-12-06 | Payer: BLUE CROSS/BLUE SHIELD | Source: Ambulatory Visit

## 2018-12-06 ENCOUNTER — Encounter (HOSPITAL_COMMUNITY): Payer: Self-pay

## 2019-01-02 ENCOUNTER — Other Ambulatory Visit (HOSPITAL_COMMUNITY): Payer: Self-pay | Admitting: Obstetrics and Gynecology

## 2019-01-02 DIAGNOSIS — Z3A32 32 weeks gestation of pregnancy: Secondary | ICD-10-CM

## 2019-01-02 DIAGNOSIS — O10913 Unspecified pre-existing hypertension complicating pregnancy, third trimester: Secondary | ICD-10-CM

## 2019-01-05 ENCOUNTER — Ambulatory Visit (HOSPITAL_COMMUNITY)
Admission: RE | Admit: 2019-01-05 | Discharge: 2019-01-05 | Disposition: A | Discharge status: Home / Self Care | Payer: BLUE CROSS/BLUE SHIELD | Source: Ambulatory Visit | Attending: Obstetrics and Gynecology | Admitting: Obstetrics and Gynecology

## 2019-01-05 ENCOUNTER — Encounter (HOSPITAL_COMMUNITY): Payer: Self-pay

## 2019-01-05 DIAGNOSIS — Z3A32 32 weeks gestation of pregnancy: Secondary | ICD-10-CM | POA: Diagnosis not present

## 2019-01-05 DIAGNOSIS — O10913 Unspecified pre-existing hypertension complicating pregnancy, third trimester: Secondary | ICD-10-CM | POA: Insufficient documentation

## 2019-01-05 DIAGNOSIS — O409XX0 Polyhydramnios, unspecified trimester, not applicable or unspecified: Secondary | ICD-10-CM | POA: Diagnosis present

## 2019-01-05 DIAGNOSIS — O99333 Smoking (tobacco) complicating pregnancy, third trimester: Secondary | ICD-10-CM | POA: Diagnosis not present

## 2019-01-05 DIAGNOSIS — O09523 Supervision of elderly multigravida, third trimester: Secondary | ICD-10-CM

## 2019-01-05 DIAGNOSIS — O10013 Pre-existing essential hypertension complicating pregnancy, third trimester: Secondary | ICD-10-CM | POA: Diagnosis not present

## 2019-01-05 DIAGNOSIS — O99213 Obesity complicating pregnancy, third trimester: Secondary | ICD-10-CM | POA: Diagnosis not present

## 2019-01-05 DIAGNOSIS — O403XX3 Polyhydramnios, third trimester, fetus 3: Secondary | ICD-10-CM

## 2019-01-05 NOTE — Procedures (Signed)
Shirley Alvarez November 05, 1981 [redacted]w[redacted]d  Fetus A Non-Stress Test Interpretation for 01/05/19  Indication: Polyhydramnios  Fetal Heart Rate A Mode: External Baseline Rate (A): 140 bpm Variability: Moderate Accelerations: 15 x 15 Decelerations: None Multiple birth?: No  Uterine Activity Mode: Toco Contraction Frequency (min): none noted  Interpretation (Fetal Testing) Nonstress Test Interpretation: Reactive Comments: FHR tracing rev'd by Dr. Donalee Citrin

## 2019-01-08 ENCOUNTER — Other Ambulatory Visit: Payer: Self-pay | Admitting: Internal Medicine

## 2019-01-26 ENCOUNTER — Inpatient Hospital Stay (HOSPITAL_COMMUNITY)
Admission: AD | Admit: 2019-01-26 | Discharge: 2019-01-26 | Disposition: A | Discharge status: Home / Self Care | Payer: BLUE CROSS/BLUE SHIELD | Attending: Obstetrics & Gynecology | Admitting: Obstetrics & Gynecology

## 2019-01-26 ENCOUNTER — Inpatient Hospital Stay (HOSPITAL_BASED_OUTPATIENT_CLINIC_OR_DEPARTMENT_OTHER): Payer: BLUE CROSS/BLUE SHIELD

## 2019-01-26 ENCOUNTER — Encounter (HOSPITAL_COMMUNITY): Payer: Self-pay

## 2019-01-26 DIAGNOSIS — O289 Unspecified abnormal findings on antenatal screening of mother: Secondary | ICD-10-CM | POA: Insufficient documentation

## 2019-01-26 DIAGNOSIS — O10013 Pre-existing essential hypertension complicating pregnancy, third trimester: Secondary | ICD-10-CM | POA: Diagnosis not present

## 2019-01-26 DIAGNOSIS — Z3A35 35 weeks gestation of pregnancy: Secondary | ICD-10-CM

## 2019-01-26 DIAGNOSIS — O288 Other abnormal findings on antenatal screening of mother: Secondary | ICD-10-CM

## 2019-01-26 NOTE — MAU Note (Signed)
Pt sent from office for NR NST. Dr Nelda Marseille would like a BPP. No pain, bleeding or LOF. + FM

## 2019-01-26 NOTE — MAU Provider Note (Signed)
S: GRACIANNA VINK is a 38 year old [id number] at 35.6 weeks who presents, from office, for BPP after NR NST.  Patient endorses fetal movement and denies contractions, LOF, vaginal bleeding and discharge.    O:  Vitals:   01/26/19 1746 01/26/19 1747  BP: 139/84   Pulse: 84   Resp:  16  Weight:  103.1 kg  Height:  5\' 2"  (1.575 m)   No results found for this or any previous visit (from the past 24 hour(s)).  No results found.    FHR: 150 bpm, Mod Var, - Decels, + Accels UC: None graphed   A: IUP at 35.6 wks Cat I FT Failed NST in Office  P: Strip reviewed-Reassuring BPP Ordered Will await results  Follow Up (7:55 PM) BPP returns 8/8, AFI 54.62VO, Cephalic, Placenta Anterior Patient discharged to home with precautions Encouraged to call primary ob or return to MAU if symptoms worsen or with the onset of new symptoms. Discharged to home in stable condition.   Milinda Cave, MSN, CNM 01/26/2019 1754

## 2019-01-26 NOTE — Discharge Instructions (Signed)

## 2019-02-02 LAB — OB RESULTS CONSOLE GBS: GBS: NEGATIVE

## 2019-02-06 ENCOUNTER — Telehealth (HOSPITAL_COMMUNITY): Payer: Self-pay | Admitting: *Deleted

## 2019-02-06 ENCOUNTER — Encounter (HOSPITAL_COMMUNITY): Payer: Self-pay | Admitting: *Deleted

## 2019-02-06 NOTE — Telephone Encounter (Signed)
Preadmission screen  

## 2019-02-10 ENCOUNTER — Other Ambulatory Visit (HOSPITAL_COMMUNITY): Payer: Self-pay | Admitting: *Deleted

## 2019-02-10 ENCOUNTER — Other Ambulatory Visit: Payer: Self-pay | Admitting: Obstetrics and Gynecology

## 2019-02-12 ENCOUNTER — Other Ambulatory Visit: Payer: Self-pay | Admitting: Obstetrics and Gynecology

## 2019-02-12 ENCOUNTER — Inpatient Hospital Stay (HOSPITAL_COMMUNITY)
Admission: AD | Admit: 2019-02-12 | Discharge: 2019-02-12 | Discharge status: Absent without leave | Payer: BLUE CROSS/BLUE SHIELD | Source: Home / Self Care | Attending: Obstetrics and Gynecology | Admitting: Obstetrics and Gynecology

## 2019-02-13 ENCOUNTER — Inpatient Hospital Stay (HOSPITAL_COMMUNITY): Payer: BLUE CROSS/BLUE SHIELD | Admitting: Anesthesiology

## 2019-02-13 ENCOUNTER — Other Ambulatory Visit: Payer: Self-pay

## 2019-02-13 ENCOUNTER — Encounter (HOSPITAL_COMMUNITY): Payer: Self-pay

## 2019-02-13 ENCOUNTER — Inpatient Hospital Stay (HOSPITAL_COMMUNITY)
Admission: AD | Admit: 2019-02-13 | Discharge: 2019-02-16 | DRG: 788 | Disposition: A | Discharge status: Home / Self Care | Payer: BLUE CROSS/BLUE SHIELD | Attending: Obstetrics and Gynecology | Admitting: Obstetrics and Gynecology

## 2019-02-13 ENCOUNTER — Inpatient Hospital Stay (HOSPITAL_COMMUNITY): Payer: BLUE CROSS/BLUE SHIELD

## 2019-02-13 DIAGNOSIS — O99214 Obesity complicating childbirth: Secondary | ICD-10-CM | POA: Diagnosis present

## 2019-02-13 DIAGNOSIS — Z87891 Personal history of nicotine dependence: Secondary | ICD-10-CM | POA: Diagnosis not present

## 2019-02-13 DIAGNOSIS — O3413 Maternal care for benign tumor of corpus uteri, third trimester: Secondary | ICD-10-CM | POA: Diagnosis present

## 2019-02-13 DIAGNOSIS — O1002 Pre-existing essential hypertension complicating childbirth: Principal | ICD-10-CM | POA: Diagnosis present

## 2019-02-13 DIAGNOSIS — Z3A38 38 weeks gestation of pregnancy: Secondary | ICD-10-CM

## 2019-02-13 DIAGNOSIS — O10919 Unspecified pre-existing hypertension complicating pregnancy, unspecified trimester: Secondary | ICD-10-CM | POA: Diagnosis present

## 2019-02-13 LAB — CBC
HCT: 38.3 % (ref 36.0–46.0)
HEMATOCRIT: 36.8 % (ref 36.0–46.0)
HEMOGLOBIN: 12.1 g/dL (ref 12.0–15.0)
Hemoglobin: 12.4 g/dL (ref 12.0–15.0)
MCH: 28.2 pg (ref 26.0–34.0)
MCH: 28.2 pg (ref 26.0–34.0)
MCHC: 32.4 g/dL (ref 30.0–36.0)
MCHC: 32.9 g/dL (ref 30.0–36.0)
MCV: 87.2 fL (ref 80.0–100.0)
MCV: 85.8 fL (ref 80.0–100.0)
Platelets: 316 10*3/uL (ref 150–400)
Platelets: 292 10*3/uL (ref 150–400)
RBC: 4.39 MIL/uL (ref 3.87–5.11)
RBC: 4.29 MIL/uL (ref 3.87–5.11)
RDW: 13.9 % (ref 11.5–15.5)
RDW: 13.7 % (ref 11.5–15.5)
WBC: 11 10*3/uL — ABNORMAL HIGH (ref 4.0–10.5)
WBC: 10.5 10*3/uL (ref 4.0–10.5)
nRBC: 0 % (ref 0.0–0.2)
nRBC: 0 % (ref 0.0–0.2)

## 2019-02-13 LAB — COMPREHENSIVE METABOLIC PANEL
ALK PHOS: 111 U/L (ref 38–126)
ALT: 22 U/L (ref 0–44)
AST: 19 U/L (ref 15–41)
Albumin: 2.8 g/dL — ABNORMAL LOW (ref 3.5–5.0)
Anion gap: 5 (ref 5–15)
BUN: <5 mg/dL — ABNORMAL LOW (ref 6–20)
CALCIUM: 8.9 mg/dL (ref 8.9–10.3)
CO2: 21 mmol/L — ABNORMAL LOW (ref 22–32)
CREATININE: 0.59 mg/dL (ref 0.44–1.00)
Chloride: 105 mmol/L (ref 98–111)
GFR calc Af Amer: >60 mL/min (ref >60)
GFR calc non Af Amer: >60 mL/min (ref >60)
Glucose, Bld: 82 mg/dL (ref 70–99)
Potassium: 3.6 mmol/L (ref 3.5–5.1)
Sodium: 131 mmol/L — ABNORMAL LOW (ref 135–145)
Total Bilirubin: 0.5 mg/dL (ref 0.3–1.2)
Total Protein: 5.7 g/dL — ABNORMAL LOW (ref 6.5–8.1)

## 2019-02-13 LAB — PROTEIN / CREATININE RATIO, URINE: Creatinine, Urine: 70.28 mg/dL

## 2019-02-13 LAB — RPR: RPR Ser Ql: NONREACTIVE

## 2019-02-13 LAB — ABO/RH: ABO/RH(D): O POS

## 2019-02-13 MED ORDER — LIDOCAINE-EPINEPHRINE (PF) 2 %-1:200000 IJ SOLN
INTRAMUSCULAR | Status: DC | PRN
  Administered 2019-02-13 (×2): 3 mL via EPIDURAL

## 2019-02-13 MED ORDER — EPHEDRINE 5 MG/ML INJ: 10.0000 mg | INTRAVENOUS | Status: DC | PRN

## 2019-02-13 MED ORDER — OXYCODONE-ACETAMINOPHEN 5-325 MG PO TABS: 2.0000 | ORAL_TABLET | ORAL | Status: DC | PRN

## 2019-02-13 MED ORDER — BUTORPHANOL TARTRATE 1 MG/ML IJ SOLN
1.0000 mg | INTRAMUSCULAR | Status: DC | PRN
  Administered 2019-02-13: 1 mg via INTRAVENOUS
  Filled 2019-02-13: qty 1

## 2019-02-13 MED ORDER — OXYTOCIN BOLUS FROM INFUSION: 500.0000 mL | Freq: Once | INTRAVENOUS | Status: DC

## 2019-02-13 MED ORDER — LACTATED RINGERS IV SOLN
500.0000 mL | INTRAVENOUS | Status: DC | PRN
  Administered 2019-02-14: 500 mL via INTRAVENOUS

## 2019-02-13 MED ORDER — PHENYLEPHRINE 40 MCG/ML (10ML) SYRINGE FOR IV PUSH (FOR BLOOD PRESSURE SUPPORT)
80.0000 ug | PREFILLED_SYRINGE | INTRAVENOUS | Status: DC | PRN
  Administered 2019-02-13 (×2): 200 ug via INTRAVENOUS
  Filled 2019-02-13: qty 10

## 2019-02-13 MED ORDER — HYDRALAZINE HCL 20 MG/ML IJ SOLN: 5.0000 mg | INTRAMUSCULAR | Status: DC | PRN

## 2019-02-13 MED ORDER — MISOPROSTOL 25 MCG QUARTER TABLET
25.0000 ug | ORAL_TABLET | ORAL | Status: DC | PRN
  Administered 2019-02-13 (×2): 25 ug via VAGINAL
  Filled 2019-02-13 (×2): qty 1

## 2019-02-13 MED ORDER — DIPHENHYDRAMINE HCL 50 MG/ML IJ SOLN: 12.5000 mg | INTRAMUSCULAR | Status: DC | PRN

## 2019-02-13 MED ORDER — ACETAMINOPHEN 325 MG PO TABS: 650.0000 mg | ORAL_TABLET | ORAL | Status: DC | PRN

## 2019-02-13 MED ORDER — LACTATED RINGERS IV SOLN
500.0000 mL | Freq: Once | INTRAVENOUS | Status: AC
  Administered 2019-02-13: 500 mL via INTRAVENOUS

## 2019-02-13 MED ORDER — FENTANYL-BUPIVACAINE-NACL 0.5-0.125-0.9 MG/250ML-% EP SOLN
12.0000 mL/h | EPIDURAL | Status: DC | PRN
  Filled 2019-02-13: qty 250

## 2019-02-13 MED ORDER — OXYTOCIN 40 UNITS IN NORMAL SALINE INFUSION - SIMPLE MED
1.0000 m[IU]/min | INTRAVENOUS | Status: DC
  Administered 2019-02-13: 1 m[IU]/min via INTRAVENOUS

## 2019-02-13 MED ORDER — PHENYLEPHRINE 40 MCG/ML (10ML) SYRINGE FOR IV PUSH (FOR BLOOD PRESSURE SUPPORT): 80.0000 ug | PREFILLED_SYRINGE | INTRAVENOUS | Status: DC | PRN

## 2019-02-13 MED ORDER — OXYTOCIN 40 UNITS IN NORMAL SALINE INFUSION - SIMPLE MED
2.5000 [IU]/h | INTRAVENOUS | Status: DC
  Filled 2019-02-13: qty 1000

## 2019-02-13 MED ORDER — LABETALOL HCL 5 MG/ML IV SOLN: 20.0000 mg | INTRAVENOUS | Status: DC | PRN

## 2019-02-13 MED ORDER — LIDOCAINE HCL (PF) 1 % IJ SOLN: 30.0000 mL | INTRAMUSCULAR | Status: DC | PRN

## 2019-02-13 MED ORDER — SODIUM CHLORIDE (PF) 0.9 % IJ SOLN
INTRAMUSCULAR | Status: DC | PRN
  Administered 2019-02-13: 12 mL/h via EPIDURAL

## 2019-02-13 MED ORDER — HYDRALAZINE HCL 20 MG/ML IJ SOLN: 10.0000 mg | INTRAMUSCULAR | Status: DC | PRN

## 2019-02-13 MED ORDER — TERBUTALINE SULFATE 1 MG/ML IJ SOLN: 0.2500 mg | Freq: Once | INTRAMUSCULAR | Status: DC | PRN

## 2019-02-13 MED ORDER — SOD CITRATE-CITRIC ACID 500-334 MG/5ML PO SOLN
30.0000 mL | ORAL | Status: DC | PRN
  Administered 2019-02-14: 30 mL via ORAL
  Filled 2019-02-13 (×2): qty 15

## 2019-02-13 MED ORDER — ONDANSETRON HCL 4 MG/2ML IJ SOLN: 4.0000 mg | Freq: Four times a day (QID) | INTRAMUSCULAR | Status: DC | PRN

## 2019-02-13 MED ORDER — ZOLPIDEM TARTRATE 5 MG PO TABS: 5.0000 mg | ORAL_TABLET | Freq: Every evening | ORAL | Status: DC | PRN

## 2019-02-13 MED ORDER — OXYCODONE-ACETAMINOPHEN 5-325 MG PO TABS: 1.0000 | ORAL_TABLET | ORAL | Status: DC | PRN

## 2019-02-13 MED ORDER — FLEET ENEMA 7-19 GM/118ML RE ENEM: 1.0000 | ENEMA | Freq: Every day | RECTAL | Status: DC | PRN

## 2019-02-13 MED ORDER — LACTATED RINGERS IV SOLN
INTRAVENOUS | Status: DC
  Administered 2019-02-13 – 2019-02-14 (×4): via INTRAVENOUS

## 2019-02-13 MED ORDER — EPHEDRINE 5 MG/ML INJ
10.0000 mg | INTRAVENOUS | Status: DC | PRN
  Administered 2019-02-13: 10 mg via INTRAVENOUS
  Filled 2019-02-13: qty 10

## 2019-02-13 MED ORDER — LABETALOL HCL 200 MG PO TABS
200.0000 mg | ORAL_TABLET | Freq: Two times a day (BID) | ORAL | Status: DC
  Administered 2019-02-13: 200 mg via ORAL
  Filled 2019-02-13: qty 1

## 2019-02-13 MED ORDER — LABETALOL HCL 5 MG/ML IV SOLN: 40.0000 mg | INTRAVENOUS | Status: DC | PRN

## 2019-02-13 MED ORDER — HYDROXYZINE HCL 50 MG PO TABS: 50.0000 mg | ORAL_TABLET | Freq: Four times a day (QID) | ORAL | Status: DC | PRN

## 2019-02-13 NOTE — Anesthesia Preprocedure Evaluation (Signed)
Anesthesia Evaluation  Patient identified by MRN, date of birth, ID band Patient awake    Reviewed: Allergy & Precautions, NPO status , Patient's Chart, lab work & pertinent test results  Airway Mallampati: III  TM Distance: >3 FB Neck ROM: Full    Dental no notable dental hx.    Pulmonary asthma , former smoker,    Pulmonary exam normal breath sounds clear to auscultation       Cardiovascular hypertension, Pt. on medications negative cardio ROS Normal cardiovascular exam Rhythm:Regular Rate:Normal     Neuro/Psych negative neurological ROS  negative psych ROS   GI/Hepatic negative GI ROS, Neg liver ROS,   Endo/Other  Morbid obesity  Renal/GU negative Renal ROS  negative genitourinary   Musculoskeletal negative musculoskeletal ROS (+)   Abdominal   Peds  Hematology negative hematology ROS (+)   Anesthesia Other Findings   Reproductive/Obstetrics (+) Pregnancy                             Anesthesia Physical Anesthesia Plan  ASA: III  Anesthesia Plan: Epidural   Post-op Pain Management:    Induction:   PONV Risk Score and Plan: Treatment may vary due to age or medical condition  Airway Management Planned: Natural Airway  Additional Equipment:   Intra-op Plan:   Post-operative Plan:   Informed Consent: I have reviewed the patients History and Physical, chart, labs and discussed the procedure including the risks, benefits and alternatives for the proposed anesthesia with the patient or authorized representative who has indicated his/her understanding and acceptance.       Plan Discussed with: Anesthesiologist  Anesthesia Plan Comments: (Patient identified. Risks, benefits, options discussed with patient including but not limited to bleeding, infection, nerve damage, paralysis, failed block, incomplete pain control, headache, blood pressure changes, nausea, vomiting,  reactions to medication, itching, and post partum back pain. Confirmed with bedside nurse the patient's most recent platelet count. Confirmed with the patient that they are not taking any anticoagulation, have any bleeding history or any family history of bleeding disorders. Patient expressed understanding and wishes to proceed. All questions were answered. )        Anesthesia Quick Evaluation

## 2019-02-13 NOTE — Progress Notes (Signed)
Shirley Alvarez is a 38 y.o. [id number] at [redacted]w[redacted]d for IOL due to [facility]  S/p Cytotec x 2.  Subjective: Pt reports contractions feel like menstrual cramps.  Objective: BP (!) 146/93 (BP Location: Right Arm)   Pulse 94   Temp (!) 97.5 F (36.4 C) (Oral)   Resp 20   Ht 5\' 2"  (1.575 m)   Wt 102.1 kg   LMP 05/20/2018   SpO2 100%   BMI 41.15 kg/m  No intake/output data recorded. No intake/output data recorded.  FHT:  FHR: 140s bpm, variability: moderate,  accelerations:  Present,  decelerations:  Present varible, prolonged x 2 minutes-none in the last hour UC:   regular, every 3 minutes SVE:   Dilation: 3 Effacement (%): 50 Station: -2 Exam by:: Dr Simona Huh  Labs: Lab Results  Component Value Date   WBC 11.0 (H) 02/13/2019   HGB 12.4 02/13/2019   HCT 38.3 02/13/2019   MCV 87.2 02/13/2019   PLT 316 02/13/2019   CMP     Component Value Date/Time   NA 131 (L) 02/13/2019 0118   K 3.6 02/13/2019 0118   CL 105 02/13/2019 0118   CO2 21 (L) 02/13/2019 0118   GLUCOSE 82 02/13/2019 0118   BUN <5 (L) 02/13/2019 0118   CREATININE 0.59 02/13/2019 0118   CALCIUM 8.9 02/13/2019 0118   PROT 5.7 (L) 02/13/2019 0118   ALBUMIN 2.8 (L) 02/13/2019 0118   AST 19 02/13/2019 0118   ALT 22 02/13/2019 0118   ALKPHOS 111 02/13/2019 0118   BILITOT 0.5 02/13/2019 0118   GFRNONAA >60 02/13/2019 0118   GFRAA >60 02/13/2019 0118   Assessment / Plan: IUP @ 38 3/7 weeks  Labor: Cervix favorable.  Start Pitocin. Preeclampsia:  labs stable and BP normal to mildly elevated Fetal Wellbeing:  Category I and previously Cat II-resolved Pain Control:  IV pain or epidural prn I/D:  n/a Anticipated MOD:  NSVD  Thurnell Lose 02/13/2019, 8:34 AM

## 2019-02-13 NOTE — Progress Notes (Signed)
Shirley Alvarez is a 38 y.o. [id number] at [redacted]w[redacted]d for IOL due to [facility]  On Pitocin 7 milliunits  Subjective: Pt reports contractions are now 3-5/10.  Pt states she normally takes Procardia XL b/w 3 and 7 pm.  Objective: BP (!) 152/92   Pulse 75   Temp 97.7 F (36.5 C) (Axillary)   Resp 18   Ht 5\' 2"  (1.575 m)   Wt 102.1 kg   LMP 05/20/2018   SpO2 100%   BMI 41.15 kg/m  No intake/output data recorded. No intake/output data recorded.  FHT:  130s, good variability,  No traced decelerations, ? variable UC:   regular, every 3 minutes SVE:   4/70/-2 AROM clear IUPC placed because contractions are not visualized on pt's side.  Labs: Lab Results  Component Value Date   WBC 11.0 (H) 02/13/2019   HGB 12.4 02/13/2019   HCT 38.3 02/13/2019   MCV 87.2 02/13/2019   PLT 316 02/13/2019   CMP     Component Value Date/Time   NA 131 (L) 02/13/2019 0118   K 3.6 02/13/2019 0118   CL 105 02/13/2019 0118   CO2 21 (L) 02/13/2019 0118   GLUCOSE 82 02/13/2019 0118   BUN <5 (L) 02/13/2019 0118   CREATININE 0.59 02/13/2019 0118   CALCIUM 8.9 02/13/2019 0118   PROT 5.7 (L) 02/13/2019 0118   ALBUMIN 2.8 (L) 02/13/2019 0118   AST 19 02/13/2019 0118   ALT 22 02/13/2019 0118   ALKPHOS 111 02/13/2019 0118   BILITOT 0.5 02/13/2019 0118   GFRNONAA >60 02/13/2019 0118   GFRAA >60 02/13/2019 0118   Assessment / Plan: IUP @ 38 3/7 weeks  Labor: Continue Pitocin. Titrate to MVUs ~200. Preeclampsia:  CHTN.  BP trending up.  Likely due to approaching next dose of Procardia and pain.  Order Labetalol in case Magnesium started. PCR sent. Fetal Wellbeing:  Category I Pain Control:  IV pain or epidural prn I/D:  n/a Anticipated MOD:  NSVD  Thurnell Lose 02/13/2019, 1:42 PM

## 2019-02-13 NOTE — H&P (Signed)
Shirley Alvarez is a 38 y.o. female G8 P0070 @ 4 3/7 weeks presenting for IOL due to Chronic HTN. Onyx with [facility] Simona Huh) complicated by:  CHTN controlled on Procardia XL 30 mg Polyhydramnios-Now resolved. AMA-low risk genetic testing Poor obstetric history Smoker Morbid Obesity  Pt denied contractions, LOF or VB at last ob visit.  Denied headaches, visual changes, abdominal pain  OB History    Gravida  8   Para  0   Term  0   Preterm  0   AB  7   Living  0     SAB  4   TAB  0   Ectopic  3   Multiple  0   Live Births             Past Medical History:  Diagnosis Date  . Allergic rhinitis   . AMA (advanced maternal age) primigravida 37+   . Fibroid   . History of ectopic pregnancy    X3   last one 07/ 2015  (received MTX injection)  . Hydrosalpinx    LEFT  . Hypertension   . Pelvic adhesions   . Wears glasses    Past Surgical History:  Procedure Laterality Date  . CHROMOPERTUBATION Bilateral 11/07/2014   Procedure: CHROMOPERTUBATION;  Surgeon: Governor Specking, MD;  Location: [facility];  Service: Gynecology;  Laterality: Bilateral;  . LAPAROSCOPIC LYSIS OF ADHESIONS N/A 11/07/2014   Procedure: LAPAROSCOPIC LYSIS OF ADHESIONS AND FIMBRIOPLASTY;  Surgeon: Governor Specking, MD;  Location: [place];  Service: Gynecology;  Laterality: N/A;  . WISDOM TOOTH EXTRACTION     Family History: family history includes Anesthesia problems in an other family member; Asthma in her sister and sister; Liver disease in her father. Social History:  reports that she quit smoking about 3 months ago. Her smoking use included cigarettes. She has a 2.50 pack-year smoking history. She has never used smokeless tobacco. She reports that she does not drink alcohol or use drugs.     Maternal Diabetes: No Genetic Screening: Normal Maternal Ultrasounds/Referrals: Normal Fetal Ultrasounds or other Referrals:  None Maternal Substance  Abuse:  Yes:  Type: Smoker Significant Maternal Medications:  Meds include: Other: Procardia XL 30 mg Significant Maternal Lab Results:  Lab values include: Group B Strep negative Other Comments:  AMA  Review of Systems  Gastrointestinal: Negative for abdominal pain.   Maternal Medical History:  Contractions: Frequency: rare.   Perceived severity is mild.    Fetal activity: Perceived fetal activity is normal.    Prenatal complications: PIH and polyhydramnios.   Prenatal Complications - Diabetes: none.      Last menstrual period 05/20/2018. Maternal Exam:  Abdomen: Estimated fetal weight is 02/09/19 6 lbs, 11 o +/- 7 oz, EFW 54%.   Fetal presentation: vertex  Introitus: Normal vulva. Pelvis: adequate for delivery.   Cervix: Cervix evaluated by digital exam.   Closed, soft, midposition  Fetal Exam Fetal Monitor Review: BPP 8/8 on 02/09/19     Physical Exam  Constitutional: She is oriented to person, place, and time. She appears well-developed and well-nourished. No distress.  Eyes: EOM are normal.  Neck: Normal range of motion.  Respiratory: Effort normal. No respiratory distress.  GI: There is no abdominal tenderness.  Genitourinary:    Vulva normal.   Musculoskeletal:        General: Edema present.  Neurological: She is alert and oriented to person, place, and time.  Skin: Skin is warm and  dry.  Psychiatric: She has a normal mood and affect.    Prenatal labs: ABO, Rh: O/Positive/-- (08/30 0000) Antibody: Negative (08/30 0000) Rubella: Immune (08/30 0000) RPR: Nonreactive (08/30 0000)  HBsAg: Negative (08/30 0000)  HIV: Non-reactive (08/30 0000)  GBS:     Assessment/Plan: IUP @ 38 3/7 weeks IOL due to [facility], poor obstetric history. Morbid obesity AMA Smoker (2-3 cigs per day) Reassuring fetal status on 02/09/19.  Start with Cytotec.  Reviewed procedures, risks.   CCOB covering until 7 am on 02/13/19.  Thurnell Lose 02/13/2019, 12:52 AM

## 2019-02-13 NOTE — Anesthesia Procedure Notes (Signed)
Epidural Patient location during procedure: OB Start time: 02/13/2019 3:00 PM End time: 02/13/2019 4:00 PM  Staffing Anesthesiologist: Freddrick March, MD Performed: anesthesiologist   Preanesthetic Checklist Completed: patient identified, pre-op evaluation, timeout performed, IV checked, risks and benefits discussed and monitors and equipment checked  Epidural Patient position: sitting Prep: site prepped and draped and DuraPrep Patient monitoring: continuous pulse ox, blood pressure, heart rate and cardiac monitor Approach: midline Location: L3-L4 Injection technique: LOR air  Needle:  Needle type: Tuohy  Needle gauge: 17 G Needle length: 15 cm Needle insertion depth: 10 cm Catheter type: closed end flexible Catheter size: 19 Gauge Catheter at skin depth: 17 cm Test dose: negative  Assessment Sensory level: T8 Events: blood not aspirated, injection not painful, no injection resistance, negative IV test and no paresthesia  Additional Notes Patient identified. Risks/Benefits/Options discussed with patient including but not limited to bleeding, infection, nerve damage, paralysis, failed block, incomplete pain control, headache, blood pressure changes, nausea, vomiting, reactions to medication both or allergic, itching and postpartum back pain. Confirmed with bedside nurse the patient's most recent platelet count. Confirmed with patient that they are not currently taking any anticoagulation, have any bleeding history or any family history of bleeding disorders. Patient expressed understanding and wished to proceed. All questions were answered. Sterile technique was used throughout the entire procedure. Please see nursing notes for vital signs. Test dose was given through epidural catheter and negative prior to continuing to dose epidural or start infusion. Warning signs of high block given to the patient including shortness of breath, tingling/numbness in hands, complete motor block,  or any concerning symptoms with instructions to call for help. Patient was given instructions on fall risk and not to get out of bed. All questions and concerns addressed with instructions to call with any issues or inadequate analgesia.  Reason for block:procedure for pain

## 2019-02-13 NOTE — Progress Notes (Signed)
NAOKO DIPERNA is a 38 y.o. [id number] at [redacted]w[redacted]d for IOL due to [facility]  On Pitocin 11 milliunits  Subjective: Pt comfortable with epidural although she is feeling rectal pressure.  Pt more comfortable after epidural bolus.  Objective: BP 125/60   Pulse 70   Temp 97.7 F (36.5 C) (Oral)   Resp 17   Ht 5\' 2"  (1.575 m)   Wt 102.1 kg   LMP 05/20/2018   SpO2 100%   BMI 41.15 kg/m  I/O last 3 completed shifts: In: -  Out: 500 [Urine:500] No intake/output data recorded.  FHT:  140s, good variability,  Subtle late deceleration resolved resolved with position change.  Occasional variable deceleration.   UC:  Couplet contractions now q 3-4 minutes. SVE:   4-5/80/0    Labs:  CBC    Component Value Date/Time   WBC 10.5 02/13/2019 1401   RBC 4.29 02/13/2019 1401   HGB 12.1 02/13/2019 1401   HCT 36.8 02/13/2019 1401   PLT 292 02/13/2019 1401   MCV 85.8 02/13/2019 1401   MCH 28.2 02/13/2019 1401   MCHC 32.9 02/13/2019 1401   RDW 13.7 02/13/2019 1401   LYMPHSABS 2.3 05/24/2018 0926   MONOABS 0.6 05/24/2018 0926   EOSABS 0.1 05/24/2018 0926   BASOSABS 0.0 05/24/2018 0926    Assessment / Plan: IUP @ 38 3/7 weeks  Labor: Continue Pitocin. Increase Pitocin until contractions q2-3 minutes. Preeclampsia:  CHTN.  BP normal.  Continue Labetalol 200 mg q 12 hours. Fetal Wellbeing:  Category II improves with position changes.  Monitor closely. Pain Control:  Epidural I/D:  AROM x~ 9 hours. Anticipated MOD:  NSVD  Thurnell Lose 02/13/2019, 8:48 PM

## 2019-02-14 ENCOUNTER — Encounter (HOSPITAL_COMMUNITY): Payer: Self-pay

## 2019-02-14 ENCOUNTER — Encounter (HOSPITAL_COMMUNITY)
Admission: AD | Disposition: A | Discharge status: Home / Self Care | Payer: Self-pay | Source: Home / Self Care | Attending: Obstetrics and Gynecology

## 2019-02-14 LAB — CBC
HCT: 34 % — ABNORMAL LOW (ref 36.0–46.0)
HEMOGLOBIN: 11.5 g/dL — AB (ref 12.0–15.0)
MCH: 29.3 pg (ref 26.0–34.0)
MCHC: 33.8 g/dL (ref 30.0–36.0)
MCV: 86.7 fL (ref 80.0–100.0)
Platelets: 268 10*3/uL (ref 150–400)
RBC: 3.92 MIL/uL (ref 3.87–5.11)
RDW: 13.9 % (ref 11.5–15.5)
WBC: 19.6 10*3/uL — ABNORMAL HIGH (ref 4.0–10.5)
nRBC: 0 % (ref 0.0–0.2)

## 2019-02-14 SURGERY — Surgical Case
Anesthesia: Epidural | Wound class: Clean Contaminated

## 2019-02-14 MED ORDER — SCOPOLAMINE 1 MG/3DAYS TD PT72: 1.0000 | MEDICATED_PATCH | Freq: Once | TRANSDERMAL | Status: DC

## 2019-02-14 MED ORDER — NALOXONE HCL 4 MG/10ML IJ SOLN
1.0000 ug/kg/h | INTRAVENOUS | Status: DC | PRN
  Filled 2019-02-14: qty 5

## 2019-02-14 MED ORDER — OXYTOCIN 40 UNITS IN NORMAL SALINE INFUSION - SIMPLE MED
INTRAVENOUS | Status: AC
  Filled 2019-02-14: qty 1000

## 2019-02-14 MED ORDER — NALBUPHINE HCL 10 MG/ML IJ SOLN: 5.0000 mg | INTRAMUSCULAR | Status: DC | PRN

## 2019-02-14 MED ORDER — SIMETHICONE 80 MG PO CHEW: 80.0000 mg | CHEWABLE_TABLET | ORAL | Status: DC | PRN

## 2019-02-14 MED ORDER — STERILE WATER FOR IRRIGATION IR SOLN
Status: DC | PRN
  Administered 2019-02-14: 1

## 2019-02-14 MED ORDER — NALBUPHINE HCL 10 MG/ML IJ SOLN: 5.0000 mg | Freq: Once | INTRAMUSCULAR | Status: DC | PRN

## 2019-02-14 MED ORDER — LIDOCAINE-EPINEPHRINE (PF) 2 %-1:200000 IJ SOLN
INTRAMUSCULAR | Status: AC
  Filled 2019-02-14: qty 20

## 2019-02-14 MED ORDER — COCONUT OIL OIL: 1.0000 "application " | TOPICAL_OIL | Status: DC | PRN

## 2019-02-14 MED ORDER — FENTANYL CITRATE (PF) 100 MCG/2ML IJ SOLN
INTRAMUSCULAR | Status: DC | PRN
  Administered 2019-02-14: 50 ug via INTRAVENOUS
  Administered 2019-02-14: 100 ug via EPIDURAL

## 2019-02-14 MED ORDER — COMPLETENATE 29-1 MG PO CHEW
1.0000 | CHEWABLE_TABLET | Freq: Every day | ORAL | Status: DC
  Administered 2019-02-14 – 2019-02-16 (×3): 1 via ORAL
  Filled 2019-02-14 (×3): qty 1

## 2019-02-14 MED ORDER — ACETAMINOPHEN 160 MG/5ML PO SOLN
1000.0000 mg | Freq: Four times a day (QID) | ORAL | Status: DC
  Administered 2019-02-14 – 2019-02-16 (×8): 1000 mg via ORAL
  Filled 2019-02-14 (×8): qty 40.6

## 2019-02-14 MED ORDER — SIMETHICONE 80 MG PO CHEW
80.0000 mg | CHEWABLE_TABLET | ORAL | Status: DC
  Administered 2019-02-15 – 2019-02-16 (×2): 80 mg via ORAL
  Filled 2019-02-14 (×2): qty 1

## 2019-02-14 MED ORDER — MORPHINE SULFATE (PF) 2 MG/ML IV SOLN: 1.0000 mg | INTRAVENOUS | Status: DC | PRN

## 2019-02-14 MED ORDER — MISOPROSTOL 200 MCG PO TABS: 800.0000 ug | ORAL_TABLET | Freq: Once | ORAL | Status: DC | PRN

## 2019-02-14 MED ORDER — DIPHENHYDRAMINE HCL 25 MG PO CAPS: 25.0000 mg | ORAL_CAPSULE | Freq: Four times a day (QID) | ORAL | Status: DC | PRN

## 2019-02-14 MED ORDER — ONDANSETRON HCL 4 MG/2ML IJ SOLN
INTRAMUSCULAR | Status: DC | PRN
  Administered 2019-02-14: 4 mg via INTRAVENOUS

## 2019-02-14 MED ORDER — DEXAMETHASONE SODIUM PHOSPHATE 10 MG/ML IJ SOLN
INTRAMUSCULAR | Status: AC
  Filled 2019-02-14: qty 1

## 2019-02-14 MED ORDER — SODIUM CHLORIDE 0.9 % IR SOLN
Status: DC | PRN
  Administered 2019-02-14 (×3): 1

## 2019-02-14 MED ORDER — MENTHOL 3 MG MT LOZG: 1.0000 | LOZENGE | OROMUCOSAL | Status: DC | PRN

## 2019-02-14 MED ORDER — DEXAMETHASONE SODIUM PHOSPHATE 10 MG/ML IJ SOLN
INTRAMUSCULAR | Status: DC | PRN
  Administered 2019-02-14: 10 mg via INTRAVENOUS

## 2019-02-14 MED ORDER — ONDANSETRON HCL 4 MG/2ML IJ SOLN: 4.0000 mg | Freq: Three times a day (TID) | INTRAMUSCULAR | Status: DC | PRN

## 2019-02-14 MED ORDER — SODIUM BICARBONATE 8.4 % IV SOLN
INTRAVENOUS | Status: DC | PRN
  Administered 2019-02-14: 3 mL via EPIDURAL
  Administered 2019-02-14: 10 mL via EPIDURAL

## 2019-02-14 MED ORDER — MEPERIDINE HCL 25 MG/ML IJ SOLN: 6.2500 mg | INTRAMUSCULAR | Status: DC | PRN

## 2019-02-14 MED ORDER — OXYTOCIN 40 UNITS IN NORMAL SALINE INFUSION - SIMPLE MED: 2.5000 [IU]/h | INTRAVENOUS | Status: AC

## 2019-02-14 MED ORDER — IBUPROFEN 600 MG PO TABS: 600.0000 mg | ORAL_TABLET | Freq: Four times a day (QID) | ORAL | Status: DC | PRN

## 2019-02-14 MED ORDER — NALOXONE HCL 0.4 MG/ML IJ SOLN: 0.4000 mg | INTRAMUSCULAR | Status: DC | PRN

## 2019-02-14 MED ORDER — TETANUS-DIPHTH-ACELL PERTUSSIS 5-2.5-18.5 LF-MCG/0.5 IM SUSP: 0.5000 mL | Freq: Once | INTRAMUSCULAR | Status: DC

## 2019-02-14 MED ORDER — LACTATED RINGERS IV SOLN
INTRAVENOUS | Status: DC | PRN
  Administered 2019-02-14 (×2): via INTRAVENOUS

## 2019-02-14 MED ORDER — SENNOSIDES-DOCUSATE SODIUM 8.6-50 MG PO TABS
2.0000 | ORAL_TABLET | ORAL | Status: DC
  Administered 2019-02-15 – 2019-02-16 (×2): 2 via ORAL
  Filled 2019-02-14 (×2): qty 2

## 2019-02-14 MED ORDER — IBUPROFEN 100 MG/5ML PO SUSP
600.0000 mg | Freq: Four times a day (QID) | ORAL | Status: DC | PRN
  Administered 2019-02-14 – 2019-02-16 (×8): 600 mg via ORAL
  Filled 2019-02-14 (×9): qty 30

## 2019-02-14 MED ORDER — ENOXAPARIN SODIUM 40 MG/0.4ML ~~LOC~~ SOLN
40.0000 mg | SUBCUTANEOUS | Status: DC
  Administered 2019-02-15 – 2019-02-16 (×2): 40 mg via SUBCUTANEOUS
  Filled 2019-02-14 (×2): qty 0.4

## 2019-02-14 MED ORDER — OXYTOCIN 10 UNIT/ML IJ SOLN
INTRAVENOUS | Status: DC | PRN
  Administered 2019-02-14: 40 [IU] via INTRAVENOUS

## 2019-02-14 MED ORDER — ACETAMINOPHEN 500 MG PO TABS
1000.0000 mg | ORAL_TABLET | Freq: Four times a day (QID) | ORAL | Status: DC
  Filled 2019-02-14: qty 2

## 2019-02-14 MED ORDER — OXYCODONE HCL 5 MG PO TABS: 5.0000 mg | ORAL_TABLET | Freq: Once | ORAL | Status: DC | PRN

## 2019-02-14 MED ORDER — ONDANSETRON HCL 4 MG/2ML IJ SOLN
INTRAMUSCULAR | Status: AC
  Filled 2019-02-14: qty 2

## 2019-02-14 MED ORDER — DIBUCAINE 1 % RE OINT: 1.0000 "application " | TOPICAL_OINTMENT | RECTAL | Status: DC | PRN

## 2019-02-14 MED ORDER — KETOROLAC TROMETHAMINE 30 MG/ML IJ SOLN: 30.0000 mg | Freq: Once | INTRAMUSCULAR | Status: DC | PRN

## 2019-02-14 MED ORDER — MORPHINE SULFATE (PF) 0.5 MG/ML IJ SOLN
INTRAMUSCULAR | Status: DC | PRN
  Administered 2019-02-14: 2 mg via INTRAVENOUS
  Administered 2019-02-14: 3 mg via EPIDURAL

## 2019-02-14 MED ORDER — OXYCODONE HCL 5 MG/5ML PO SOLN: 5.0000 mg | ORAL | Status: DC | PRN

## 2019-02-14 MED ORDER — PROMETHAZINE HCL 25 MG/ML IJ SOLN: 6.2500 mg | INTRAMUSCULAR | Status: DC | PRN

## 2019-02-14 MED ORDER — NIFEDIPINE ER OSMOTIC RELEASE 30 MG PO TB24
30.0000 mg | ORAL_TABLET | Freq: Every day | ORAL | Status: DC
  Administered 2019-02-14 – 2019-02-16 (×3): 30 mg via ORAL
  Filled 2019-02-14 (×3): qty 1

## 2019-02-14 MED ORDER — FENTANYL CITRATE (PF) 100 MCG/2ML IJ SOLN
INTRAMUSCULAR | Status: AC
  Filled 2019-02-14: qty 2

## 2019-02-14 MED ORDER — SODIUM CHLORIDE 0.9% FLUSH: 3.0000 mL | INTRAVENOUS | Status: DC | PRN

## 2019-02-14 MED ORDER — SIMETHICONE 80 MG PO CHEW
80.0000 mg | CHEWABLE_TABLET | Freq: Three times a day (TID) | ORAL | Status: DC
  Administered 2019-02-14 – 2019-02-16 (×7): 80 mg via ORAL
  Filled 2019-02-14 (×7): qty 1

## 2019-02-14 MED ORDER — PRENATAL MULTIVITAMIN CH
1.0000 | ORAL_TABLET | Freq: Every day | ORAL | Status: DC
  Filled 2019-02-14: qty 1

## 2019-02-14 MED ORDER — OXYCODONE HCL 5 MG/5ML PO SOLN: 5.0000 mg | Freq: Once | ORAL | Status: DC | PRN

## 2019-02-14 MED ORDER — LACTATED RINGERS IV SOLN
INTRAVENOUS | Status: DC | PRN
  Administered 2019-02-14: 05:00:00 via INTRAVENOUS

## 2019-02-14 MED ORDER — WITCH HAZEL-GLYCERIN EX PADS: 1.0000 "application " | MEDICATED_PAD | CUTANEOUS | Status: DC | PRN

## 2019-02-14 MED ORDER — LACTATED RINGERS IV SOLN
INTRAVENOUS | Status: DC
  Administered 2019-02-14 (×2): via INTRAVENOUS

## 2019-02-14 MED ORDER — SODIUM BICARBONATE 8.4 % IV SOLN
INTRAVENOUS | Status: AC
  Filled 2019-02-14: qty 50

## 2019-02-14 MED ORDER — MORPHINE SULFATE (PF) 0.5 MG/ML IJ SOLN
INTRAMUSCULAR | Status: AC
  Filled 2019-02-14: qty 10

## 2019-02-14 MED ORDER — DIPHENHYDRAMINE HCL 25 MG PO CAPS: 25.0000 mg | ORAL_CAPSULE | ORAL | Status: DC | PRN

## 2019-02-14 MED ORDER — CEFAZOLIN SODIUM-DEXTROSE 2-3 GM-%(50ML) IV SOLR
INTRAVENOUS | Status: DC | PRN
  Administered 2019-02-14: 2 g via INTRAVENOUS

## 2019-02-14 MED ORDER — ZOLPIDEM TARTRATE 5 MG PO TABS: 5.0000 mg | ORAL_TABLET | Freq: Every evening | ORAL | Status: DC | PRN

## 2019-02-14 MED ORDER — OXYCODONE HCL 5 MG PO TABS: 5.0000 mg | ORAL_TABLET | ORAL | Status: DC | PRN

## 2019-02-14 MED ORDER — HYDROMORPHONE HCL 1 MG/ML IJ SOLN: 0.2500 mg | INTRAMUSCULAR | Status: DC | PRN

## 2019-02-14 MED ORDER — DIPHENHYDRAMINE HCL 50 MG/ML IJ SOLN: 12.5000 mg | INTRAMUSCULAR | Status: DC | PRN

## 2019-02-14 MED ORDER — LACTATED RINGERS AMNIOINFUSION
INTRAVENOUS | Status: DC
  Administered 2019-02-14: 02:00:00 via INTRAUTERINE

## 2019-02-14 SURGICAL SUPPLY — 36 items
ADH SKN CLS APL DERMABOND .7 (GAUZE/BANDAGES/DRESSINGS)
BARRIER ADHS 3X4 INTERCEED (GAUZE/BANDAGES/DRESSINGS) ×2 IMPLANT
BENZOIN TINCTURE PRP APPL 2/3 (GAUZE/BANDAGES/DRESSINGS) ×2 IMPLANT
CHLORAPREP W/TINT 26ML (MISCELLANEOUS) ×2 IMPLANT
CLAMP CORD UMBIL (MISCELLANEOUS) IMPLANT
CLOTH BEACON ORANGE TIMEOUT ST (SAFETY) ×2 IMPLANT
DERMABOND ADVANCED (GAUZE/BANDAGES/DRESSINGS)
DERMABOND ADVANCED .7 DNX12 (GAUZE/BANDAGES/DRESSINGS) IMPLANT
DRSG OPSITE POSTOP 4X10 (GAUZE/BANDAGES/DRESSINGS) ×2 IMPLANT
ELECT REM PT RETURN 9FT ADLT (ELECTROSURGICAL) ×2
ELECTRODE REM PT RTRN 9FT ADLT (ELECTROSURGICAL) ×1 IMPLANT
EXTRACTOR VACUUM BELL STYLE (SUCTIONS) IMPLANT
GLOVE BIO SURGEON STRL SZ7 (GLOVE) ×2 IMPLANT
GLOVE BIOGEL PI IND STRL 7.0 (GLOVE) ×2 IMPLANT
GLOVE BIOGEL PI INDICATOR 7.0 (GLOVE) ×2
GOWN STRL REUS W/TWL LRG LVL3 (GOWN DISPOSABLE) ×4 IMPLANT
KIT ABG SYR 3ML LUER SLIP (SYRINGE) IMPLANT
NEEDLE HYPO 25X5/8 SAFETYGLIDE (NEEDLE) IMPLANT
NS IRRIG 1000ML POUR BTL (IV SOLUTION) ×2 IMPLANT
PACK C SECTION WH (CUSTOM PROCEDURE TRAY) ×2 IMPLANT
PAD ABD 8X10 STRL (GAUZE/BANDAGES/DRESSINGS) ×2 IMPLANT
PAD OB MATERNITY 4.3X12.25 (PERSONAL CARE ITEMS) ×2 IMPLANT
PENCIL SMOKE EVAC W/HOLSTER (ELECTROSURGICAL) ×2 IMPLANT
RTRCTR C-SECT PINK 25CM LRG (MISCELLANEOUS) ×2 IMPLANT
STRIP CLOSURE SKIN 1/2X4 (GAUZE/BANDAGES/DRESSINGS) ×2 IMPLANT
SUT CHROMIC 0 CTX 36 (SUTURE) IMPLANT
SUT MON AB 4-0 PS1 27 (SUTURE) ×2 IMPLANT
SUT PLAIN 0 NONE (SUTURE) IMPLANT
SUT PLAIN 2 0 XLH (SUTURE) ×2 IMPLANT
SUT VIC AB 0 CTX 36 (SUTURE) ×5
SUT VIC AB 0 CTX36XBRD ANBCTRL (SUTURE) ×5 IMPLANT
SUT VIC AB 2-0 CT1 27 (SUTURE) ×1
SUT VIC AB 2-0 CT1 TAPERPNT 27 (SUTURE) ×1 IMPLANT
TOWEL OR 17X24 6PK STRL BLUE (TOWEL DISPOSABLE) ×2 IMPLANT
TRAY FOLEY W/BAG SLVR 14FR LF (SET/KITS/TRAYS/PACK) IMPLANT
WATER STERILE IRR 1000ML POUR (IV SOLUTION) ×2 IMPLANT

## 2019-02-14 NOTE — Anesthesia Postprocedure Evaluation (Signed)
Anesthesia Post Note  Patient: Shirley Alvarez  Procedure(s) Performed: CESAREAN SECTION (N/A )     Patient location during evaluation: Mother Baby Anesthesia Type: Epidural Level of consciousness: awake and alert Pain management: pain level controlled Vital Signs Assessment: post-procedure vital signs reviewed and stable Respiratory status: spontaneous breathing, nonlabored ventilation and respiratory function stable Cardiovascular status: stable Postop Assessment: no headache, no backache and epidural receding Anesthetic complications: no    Last Vitals:  Vitals:   02/14/19 0701 02/14/19 0702  BP: 137/84   Pulse: 73 76  Resp: (!) 22 16  Temp: 36.5 C   SpO2: 99% 100%    Last Pain:  Vitals:   02/14/19 0701  TempSrc: Axillary  PainSc:    Pain Goal:                Epidural/Spinal Function Cutaneous sensation: Able to Wiggle Toes (02/14/19 0645), Patient able to flex knees: Yes (02/14/19 0645), Patient able to lift hips off bed: Yes (02/14/19 0645), Back pain beyond tenderness at insertion site: No (02/14/19 0645), Progressively worsening motor and/or sensory loss: No (02/14/19 0645), Bowel and/or bladder incontinence post epidural: No (02/14/19 0645)  Lynda Rainwater

## 2019-02-14 NOTE — Brief Op Note (Addendum)
02/14/2019  5:48 AM  PATIENT:  Shirley Alvarez  38 y.o. female  PRE-OPERATIVE DIAGNOSIS:  IUP @ 38 4/7 weeks, Failure to Progress, CHTN  POST-OPERATIVE DIAGNOSIS: Same  PROCEDURE:  Procedure(s): CESAREAN SECTION (N/A), Primary LTCS, 2 layer closure  SURGEON:  Surgeon(s) and Role:    Thurnell Lose, MD - Primary  PHYSICIAN ASSISTANT:   ASSISTANTS: Noralyn Pick, CNM   ANESTHESIA:   epidural  EBL:  442 mL   BLOOD ADMINISTERED:none  DRAINS: Urinary Catheter (Foley)   LOCAL MEDICATIONS USED:  NONE  SPECIMEN:  Source of Specimen:  Placenta  DISPOSITION OF SPECIMEN:  PATHOLOGY  COUNTS:  YES  TOURNIQUET:  * No tourniquets in log *  DICTATION: .Other Dictation: Dictation Number [id number]  PLAN OF CARE: Transfer to postpartum after PACU  PATIENT DISPOSITION:  PACU - hemodynamically stable.   Delay start of Pharmacological VTE agent (>24hrs) due to surgical blood loss or risk of bleeding: no

## 2019-02-14 NOTE — Progress Notes (Signed)
In to assess pt's progress due to failure to progress despite adequate contractions. Pt comfortable.  Still feeling rectal pressure.  BP 125/74   Pulse 86   Temp 97.9 F (36.6 C) (Oral)   Resp 18   Ht 5\' 2"  (1.575 m)   Wt 102.1 kg   LMP 05/20/2018   SpO2 100%   BMI 41.15 kg/m   Gen:  NAD Cervix: 6/70/0  Molding palpated, suspect asynclitic presentation, no bloody show EM:  140s, good variability, accelerations present, intermittent late decelerations. Contraction q 3 min MVUs ~ 240 Pitocin 19 milliunits  A/P IUP at 38 4/7 weeks  Failure to progress.  Adequate contractions x 4+  Cat II tracing (late decelerations, intermittent) CHTN BP well controlled  Discussed R/B/A with pt and husband.  Pt has a h/o pelvic adhesions which can complicate procedure.  They did not have any questions.  Consent signed.

## 2019-02-14 NOTE — Op Note (Signed)
NAME: Shirley Alvarez, [place] MEDICAL RECORD GQ:[id number] ACCOUNT [id number] DATE OF BIRTH:Aug 25, 1981 FACILITY: [facility] LOCATION: [facility] PHYSICIAN:Antoinette Haskett Al Decant, MD  OPERATIVE REPORT  DATE OF PROCEDURE:  02/14/2019  PREOPERATIVE DIAGNOSES:   1.  Intrauterine pregnancy at 77 and 4/7 weeks, failure to progress.   2.  Chronic hypertension.  POSTOPERATIVE DIAGNOSES:   1.  Intrauterine pregnancy at 67 and 4/7 weeks, failure to progress.   2.  Chronic hypertension. Marland Kitchen  PROCEDURE:  Primary low transverse cesarean section with 2 layer closure.  SURGEON:  Thurnell Lose, MD  ASSISTANT:  Dorthea Cove and technician.  ANESTHESIA:  Epidural.  ESTIMATED BLOOD LOSS:  442 mL.  DRAINS:  Foley catheter, clear urine.  LOCAL:  None.  SPECIMEN:  Placenta.  DISPOSITION OF SPECIMEN:  To pathology.  PATIENT  DISPOSITION:  To PACU hemodynamically stable.  FINDINGS:  Viable female infant in the vertex position.  Nuchal cord x3.  Apgars 9,9, but spontaneous cry at bedside.  Uterine laceration suspected due to pelvic adhesions.  Fibroids noted anteriorly, but not near the lower uterine segment fibroid  x2.  Normal ovaries bilaterally and tubes.  There were no significant adhesions visualized.  INDICATIONS:  The patient was admitted for induction of labor due to chronic hypertension.  She received 2 doses of Cytotec and advanced to 3 cm and had painful contractions and was effacing.  She was then ruptured at 4 cm and effaced and -2 station.   IUPC was placed to trace contractions.  Pitocin was started  prior to rupture.  Pitocin was titrated.  Adequate contractions were noted, but she was ruptured at 1:00 p.m. and she was 4 cm and at 3 a.m., she was 6 and in 4-5 hours,  she remained the same.   Also, baby was having some intermittent late decels they were not consistent also variable decels.  IUPC  was started, but did not resolve.  Also asynclitic presentation was suspected.  The patient was  counseled on risks, benefits and alternatives of  cesarean section, desire to proceed.  DESCRIPTION OF PROCEDURE:  The patient was taken to the operating room with IV running.  She was placed in the dorsal supine position.  Epidural anesthesia was optimized tracks.  A Traxi retractor was then placed on her abdomen.  She was then prepped in  a normal fashion.  A timeout was performed.  SCDs were on her legs and operating.  She received Ancef 2 grams IV.  She was then draped.  Allis clamps were used to confirm adequate anesthesia.  Abdomen was marked for a Pfannenstiel skin incision 2 cm above the symphysis pubis.  The skin was incised with the scalpel and carried down to the underlying layer of the fascia with the Bovie.  Subcutaneous space was then cleared off of the fascia that  was incised at the midline and extended laterally with curved Mayo scissors.  The Kocher clamps were used to dissect the rectus muscles sharply off of the fascia.  The muscles were separated in the midline with the Bovie.  Peritoneum was then tented up  with 2 hemostats and entered sharply with the Metzenbaum scissors.  We then stretched the peritoneum.  Upon stressing the peritoneum, I noted that there was blood in the vault and when we looked, there was a linear laceration that was approximately 3 cm.   It looked a little deep and it was not actively bleeding.  It looked like it just happened, so it started oozing.  The bleeding  was cauterized with the Bovie.  Attention was turned to the lower uterine segment.  The serosa was picked up with the with the Russian's and it was extended laterally.  A transverse incision was made on the lower uterine segment with the scalpel.  Allis clamps were used to grasp the  myometrium and tented up.  It was eventually entered in order shot out.  Incision was extended with the bandage scissors.  Head was brought to the incision without difficulty.  Fundal pressure was used.  Nuchal cord x3  reduced.  Nose and mouth were  suctioned.  Baby was then delivered easily and again nose and mouth were suctioned.  Baby's eyes were open and she did not appear in distress, but she was not crying on the field.  Cord was clamped x2 and cut and handed off to the waiting NICU staff.   Once the baby was placed in the attendant's arms, she started crying.  She was carried to the bedside.  Placenta was removed.  Uterus was cleared of all clots and debris.  The uterine  cavity felt normal.  There were intestines and omentum and the surgical field, so a moist laparotomy sponge was tagged and placed in the abdomen.  The uterus was closed with a running locked suture of 0 Vicryl.  Second layer of the same suture was used for imbrication.  The laceration was then repaired with a 0 Vicryl in a continuous fashion.  It was hemostatic.  Irrigation was done of all the  gutters.  I then put Interceed over the incisions.  The peritoneum was then closed with 2-0 Vicryl in a continuous fashion and all layers were inspected for bleeding and cauterized with the Bovie and irrigation was performed at every level.  The fascia  was then closed with 0 Vicryl.  The subcutaneous space was then closed with 2-0 plain gut in a continuous fashion.  The skin was reapproximated with 4-0 Monocryl in a subcuticular fashion.  The patient tolerated the procedure well.    She will be taken to the PACU in stable condition.  Baby remained in the room.    All sponge, instruments and needle counts were correct x3.  AN/NUANCE  D:02/14/2019 T:02/14/2019 JOB:[id number]

## 2019-02-14 NOTE — Lactation Note (Signed)
This note was copied from a baby's chart. Lactation Consultation Note  Patient Name: Shirley Alvarez JOACZ'Y Date: 02/14/2019 Reason for consult: Initial assessment;1st time breastfeeding;Early term 37-38.6wks;Infant < 6lbs  Baby is  39  1/2 hours old and mom called for assistance latching.  As LC entered the room mom was pre - pumping with a hand pump.  LC reviewed potential feeding behaviors of a Early term infant - 38 4/7 days/ less than 6 pounds ( 5 -2.5 oz )  Baby awake after diaper check/ and LC reviewed and showed mom how  Hand express /glistening on the nipple and  Areola/ areola compressible for latch. Baby latched easily with depth and fed for 8 mins with swallows / increased  With breast compressions / and per mom comfortable / nipple well rounded when baby released.  Baby rooted for a short time and started getting sleepy. LC assisted to lay baby on moms chest.  LC recommended and encouraged mom prior to latching - breast massage / hand express / pre- pump to prime the milk ducts.  Latch with firm support as shown.  LC also showed dad how he could help mom to obtain the depth.  LC recommended baby feed for 15 -20 mins/ 30 mins max.  Janyth Pupa MBURN aware to set up a DEBP for post pumping.  Mother informed of post-discharge support and given phone number to the lactation department, including services for phone call assistance; out-patient appointments; and breastfeeding support group. List of other breastfeeding resources in the community given in the handout. Encouraged mother to call for problems or concerns related to breastfeeding.  Both mom and dad receptive to breast feeding teaching and expressed appreciation for LC assist.     Maternal Data Has patient been taught Hand Expression?: Yes(small drops from the right and left ) Does the patient have breastfeeding experience prior to this delivery?: No  Feeding Feeding Type: Breast Fed  LATCH Score Latch: Grasps  breast easily, tongue down, lips flanged, rhythmical sucking.  Audible Swallowing: A few with stimulation(increased with breast compressions )  Type of Nipple: Everted at rest and after stimulation  Comfort (Breast/Nipple): Soft / non-tender  Hold (Positioning): Assistance needed to correctly position infant at breast and maintain latch.  LATCH Score: 8  Interventions    Lactation Tools Discussed/Used Tools: Pump Breast pump type: Manual(DEBP will need to be set up by the RN )   Consult Status Consult Status: Follow-up Date: 02/15/19 Follow-up type: In-patient    [place] 02/14/2019, 1:09 PM

## 2019-02-14 NOTE — Transfer of Care (Signed)
Immediate Anesthesia Transfer of Care Note  Patient: Shirley Alvarez  Procedure(s) Performed: CESAREAN SECTION (N/A )  Patient Location: PACU  Anesthesia Type:Epidural  Level of Consciousness: awake, alert  and oriented  Airway & Oxygen Therapy: Patient Spontanous Breathing  Post-op Assessment: Report given to RN and Post -op Vital signs reviewed and stable  Post vital signs: Reviewed and stable  Last Vitals:  Vitals Value Taken Time  BP 131/86 02/14/2019  6:00 AM  Temp    Pulse 88 02/14/2019  6:01 AM  Resp 17 02/14/2019  6:01 AM  SpO2 100 % 02/14/2019  6:01 AM  Vitals shown include unvalidated device data.  Last Pain:  Vitals:   02/14/19 0204  TempSrc: Oral  PainSc:          Complications: No apparent anesthesia complications

## 2019-02-15 LAB — CREATININE, SERUM
Creatinine, Ser: 0.73 mg/dL (ref 0.44–1.00)
GFR calc Af Amer: >60 mL/min (ref >60)
GFR calc non Af Amer: >60 mL/min (ref >60)

## 2019-02-15 LAB — CBC
HEMATOCRIT: 27.1 % — AB (ref 36.0–46.0)
Hemoglobin: 9.1 g/dL — ABNORMAL LOW (ref 12.0–15.0)
MCH: 29.3 pg (ref 26.0–34.0)
MCHC: 33.6 g/dL (ref 30.0–36.0)
MCV: 87.1 fL (ref 80.0–100.0)
Platelets: 241 10*3/uL (ref 150–400)
RBC: 3.11 MIL/uL — ABNORMAL LOW (ref 3.87–5.11)
RDW: 14 % (ref 11.5–15.5)
WBC: 16.6 10*3/uL — AB (ref 4.0–10.5)
nRBC: 0 % (ref 0.0–0.2)

## 2019-02-15 NOTE — Anesthesia Postprocedure Evaluation (Signed)
Anesthesia Post Note  Patient: Shirley Alvarez  Procedure(s) Performed: CESAREAN SECTION (N/A )     Patient location during evaluation: Mother Baby Anesthesia Type: Epidural Level of consciousness: awake and alert and oriented Pain management: satisfactory to patient Vital Signs Assessment: post-procedure vital signs reviewed and stable Respiratory status: respiratory function stable Cardiovascular status: stable Postop Assessment: no headache, no backache, epidural receding, patient able to bend at knees, no signs of nausea or vomiting and adequate PO intake Anesthetic complications: no    Last Vitals:  Vitals:   02/15/19 0605 02/15/19 1000  BP:  125/67  Pulse:  65  Resp:    Temp:    SpO2: 100%     Last Pain:  Vitals:   02/15/19 1000  TempSrc:   PainSc: 0-No pain   Pain Goal:                   Seab Axel

## 2019-02-15 NOTE — Progress Notes (Signed)
Subjective: Postop Day 1: Cesarean Delivery No complaints.  Pain controlled.  Lochia normal.  Breast feeding Yes, gave formula overnight.  Objective: Temp:  [98 F (36.7 C)-98.7 F (37.1 C)] 98 F (36.7 C) (03/25 0300) Pulse Rate:  [62-88] 68 (03/25 0300) Resp:  [18-20] 18 (03/25 0300) BP: (105-143)/(63-77) 110/67 (03/25 0300) SpO2:  [99 %-100 %] 100 % (03/25 4034)  Physical Exam: Gen: NAD Lochia: Not visualized Uterine Fundus: firm, appropriately tender Incision: Pressure dressing clean DVT Evaluation: + Edema present, no calf tenderness bilaterally   Recent Labs    02/14/19 0736 02/15/19 0649  HGB 11.5* 9.1*  HCT 34.0* 27.1*    Assessment/Plan: Status post C-section-doing well postoperatively.  Remove pressure dressing today.  Encouraged ambulation TID. CHTN-BP well controlled.  Continue Procardia XL 30 mg. DVT prophylaxis- Lovenox 40 mg daily. Lactation support.    Thurnell Lose 02/15/2019, 8:36 AM

## 2019-02-15 NOTE — Addendum Note (Signed)
Addendum  created 02/15/19 1140 by Flossie Dibble, CRNA   Clinical Note Signed

## 2019-02-16 MED ORDER — IBUPROFEN 100 MG/5ML PO SUSP: 600.0000 mg | Freq: Four times a day (QID) | ORAL | 1 refills | Status: DC | PRN

## 2019-02-16 NOTE — Progress Notes (Signed)
Subjective: Postop Day 2: Cesarean Delivery Resting comfortably in bed.  Pain controlled.  Lochia normal.  Breast feeding Yes, gave formula overnight. No F/C/CP/SOB.  Tolerating gen diet.  Ambulating and voiding without difficulty.  +flatus, +BM  Objective: Temp:  [98.2 F (36.8 C)-98.5 F (36.9 C)] 98.2 F (36.8 C) (03/26 0628) Pulse Rate:  [65-81] 72 (03/26 0628) Resp:  [16-18] 17 (03/26 0628) BP: (104-137)/(67-84) 130/84 (03/26 0628) SpO2:  [100 %] 100 % (03/26 4982)  Physical Exam: Gen: NAD CV: RRR Lungs: CTAB Lochia: Not visualized Abd: soft, non-tender, +BS Uterine Fundus: firm, appropriately tender Incision: C/D/I with honeycomb DVT Evaluation: + Edema present, no calf tenderness bilaterally   Recent Labs    02/14/19 0736 02/15/19 0649  HGB 11.5* 9.1*  HCT 34.0* 27.1*    Assessment/Plan: Status post C-section-doing well postoperatively.  Encouraged ambulation TID. CHTN-BP well controlled.  Continue Procardia XL 30 mg. DVT prophylaxis- Lovenox 40 mg daily. Lactation support.  Continue routine postpartum care   Shirley Alvarez 02/16/2019, 7:15 AM

## 2019-02-16 NOTE — Discharge Instructions (Signed)

## 2019-02-16 NOTE — Lactation Note (Signed)
This note was copied from a baby's chart. Lactation Consultation Note  Patient Name: Shirley Alvarez IEPPI'R Date: 02/16/2019 Reason for consult: Follow-up assessment;Infant < 6lbs Baby is 54 hours old/0% weight loss.  Mom reports she offers breast prior to supplementing.  Baby latches at times.  Mom is post pumping with symphony pump and supplementing with formula.  She is using a bottle and slow flow nipple.  Stressed importance of establishing and maintaining her milk supply as baby learns to breastfeed.  Instructed to feed with cues and call for assist prn.  Maternal Data    Feeding    LATCH Score                   Interventions    Lactation Tools Discussed/Used     Consult Status Consult Status: Follow-up Date: 02/17/19 Follow-up type: In-patient    Ave Filter 02/16/2019, 10:57 AM

## 2019-02-17 ENCOUNTER — Encounter (HOSPITAL_COMMUNITY): Payer: Self-pay | Admitting: Obstetrics and Gynecology

## 2019-02-17 LAB — TYPE AND SCREEN
ABO/RH(D): O POS
ANTIBODY SCREEN: NEGATIVE
Unit division: 0
Unit division: 0

## 2019-02-17 LAB — BPAM RBC
BLOOD PRODUCT EXPIRATION DATE: 202004172359
Blood Product Expiration Date: 202004172359
Unit Type and Rh: 5100
Unit Type and Rh: 5100

## 2019-03-01 NOTE — Discharge Summary (Signed)
OB Discharge Summary     Patient Name: Shirley Alvarez DOB: 01-04-1981 MRN: [id number]  Date of admission: 02/13/2019 Delivering MD: Thurnell Lose   Date of discharge: 3/262020  Admitting diagnosis: pregnancy Intrauterine pregnancy: [redacted]w[redacted]d     Secondary diagnosis:  Active Problems:   Chronic hypertension during pregnancy  Additional problems: Obesity     Discharge diagnosis: Term Pregnancy Delivered and CHTN                                                                                                Post partum procedures:n/a  Augmentation: AROM, Pitocin and Cytotec  Complications: None  Hospital course:  Induction of Labor With Cesarean Section  38 y.o. yo [id number] at [redacted]w[redacted]d was admitted to the hospital 02/13/2019 for induction of labor. Patient had a labor course significant for failure to progress. The patient went for cesarean section due to Arrest of Dilation, and delivered a Viable infant,02/14/2019  Membrane Rupture Time/Date: 1:25 PM ,02/13/2019   Details of operation can be found in separate operative Note.  Patient had an uncomplicated postpartum course. She is ambulating, tolerating a regular diet, passing flatus, and urinating well.  Patient is discharged home in stable condition on 03/01/19.                                    Physical exam  Vitals:   02/15/19 1451 02/15/19 2243 02/16/19 0628 02/16/19 1100  BP: 104/67 137/68 130/84 137/81  Pulse: 79 81 72 75  Resp: 16 18 17 18   Temp: 98.5 F (36.9 C) 98.5 F (36.9 C) 98.2 F (36.8 C) 98.6 F (37 C)  TempSrc: Oral Oral  Oral  SpO2:  100% 100% 100%  Weight:      Height:       General: alert, cooperative and no distress Lochia: appropriate Uterine Fundus: firm Incision: Dressing is clean, dry, and intact DVT Evaluation: No evidence of DVT seen on physical exam. Labs: Lab Results  Component Value Date   WBC 16.6 (H) 02/15/2019   HGB 9.1 (L) 02/15/2019   HCT 27.1 (L) 02/15/2019   MCV 87.1  02/15/2019   PLT 241 02/15/2019   CMP Latest Ref Rng & Units 02/15/2019  Glucose 70 - 99 mg/dL -  BUN 6 - 20 mg/dL -  Creatinine 0.44 - 1.00 mg/dL 0.73  Sodium 135 - 145 mmol/L -  Potassium 3.5 - 5.1 mmol/L -  Chloride 98 - 111 mmol/L -  CO2 22 - 32 mmol/L -  Calcium 8.9 - 10.3 mg/dL -  Total Protein 6.5 - 8.1 g/dL -  Total Bilirubin 0.3 - 1.2 mg/dL -  Alkaline Phos 38 - 126 U/L -  AST 15 - 41 U/L -  ALT 0 - 44 U/L -    Discharge instruction: per After Visit Summary and "Baby and Me Booklet".  After visit meds:  Allergies as of 02/16/2019   No Known Allergies     Medication List    STOP taking these medications   aspirin  EC 81 MG tablet     TAKE these medications   acetaminophen 500 MG tablet Commonly known as:  TYLENOL Take 1,000 mg by mouth 4 (four) times daily as needed for headache.   ibuprofen 100 MG/5ML suspension Commonly known as:  ADVIL,MOTRIN Take 30 mLs (600 mg total) by mouth every 6 (six) hours as needed for mild pain or moderate pain (cramping).   NIFEdipine 30 MG 24 hr tablet Commonly known as:  PROCARDIA-XL/NIFEDICAL-XL TAKE 1 TABLET(30 MG) BY MOUTH DAILY   PNV PO Take 1 tablet by mouth daily.       Diet: low salt diet  Activity: Advance as tolerated. Pelvic rest for 6 weeks.   Outpatient follow up:2 weeks Follow up Appt:No future appointments. Follow up Visit:No follow-ups on file.  Postpartum contraception: Not Discussed  Newborn Data: Live born female  Birth Weight: 5 lb 2.5 oz (2340 g) APGAR: 66, 9  Newborn Delivery   Birth date/time:  02/14/2019 04:53:00 Delivery type:  C-Section, Low Transverse Trial of labor:  Yes C-section categorization:  Primary     Baby Feeding: Breast Disposition:home with mother   03/01/2019 Annalee Genta, DO

## 2019-03-31 IMAGING — US US MFM FETAL BPP W/ NON-STRESS
1 series · 12 of 28 positions shown · non-contrast
Comparison: none

[Series 1: us mfm fetal bpp w/ non-stress · 31 acquisitions, 12 frames shown]
[im 2/31]
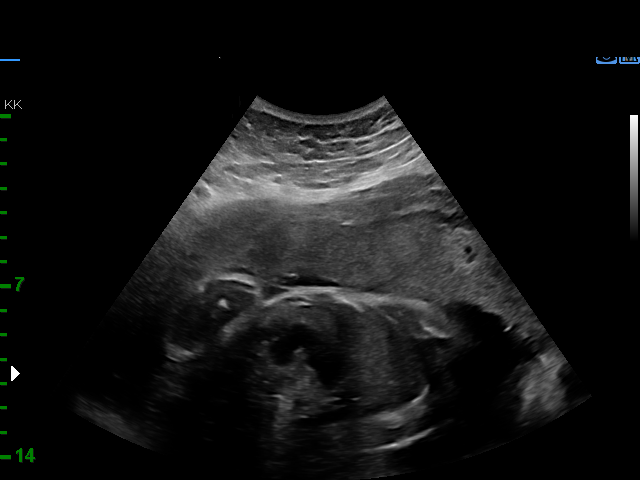
[im 4/31]
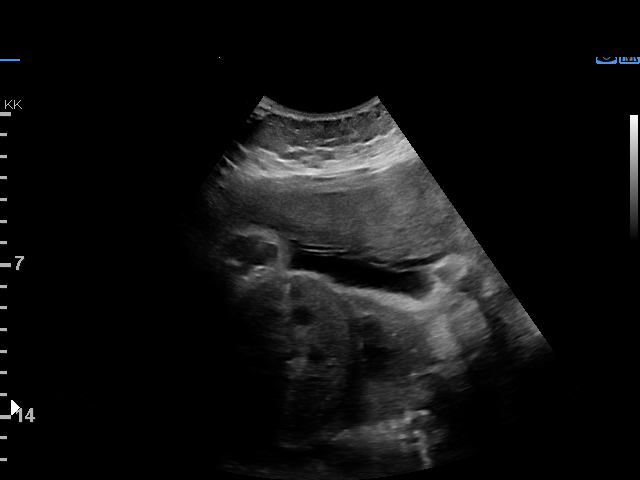
[im 6/31]
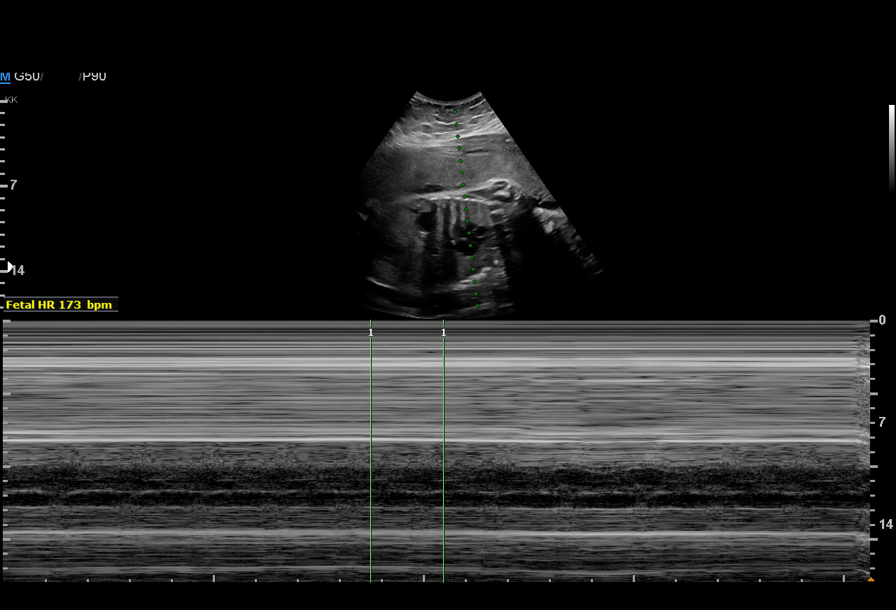
[im 9/31]
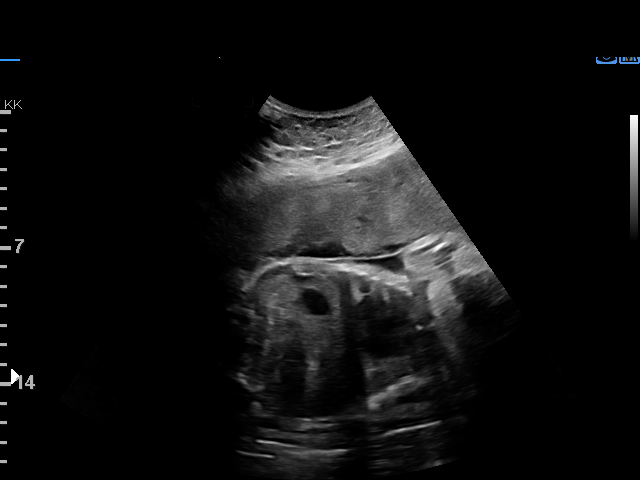
[im 12/31]
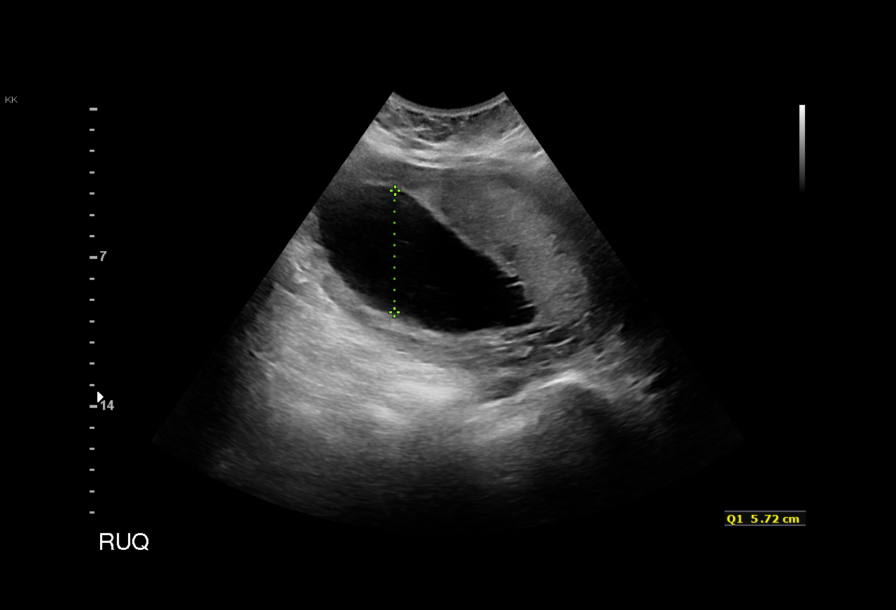
[im 14/31]
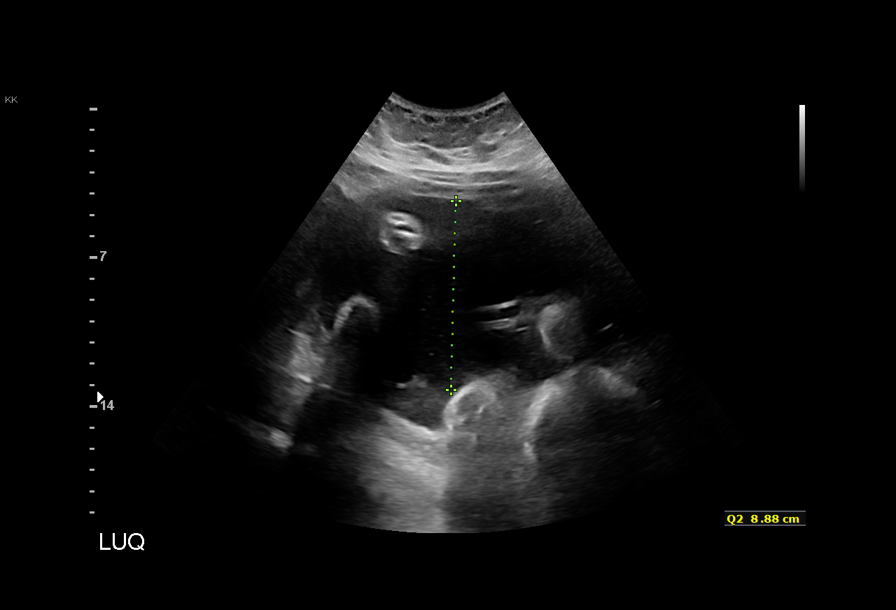
[im 17/31]
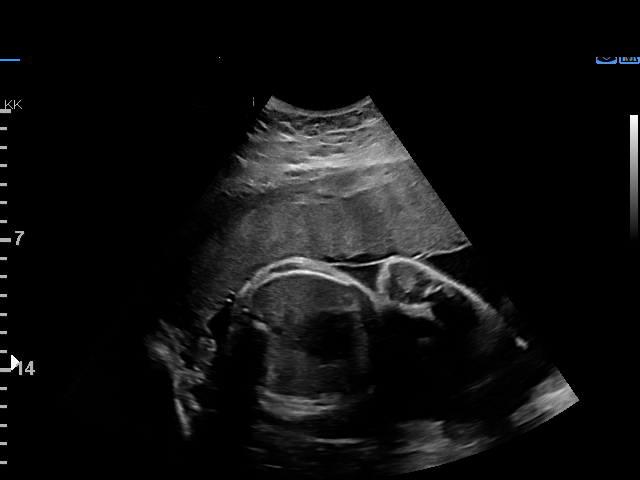
[im 19/31]
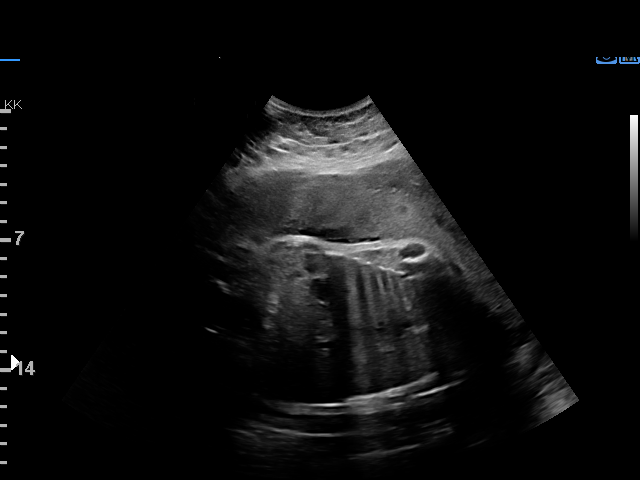
[im 22/31]
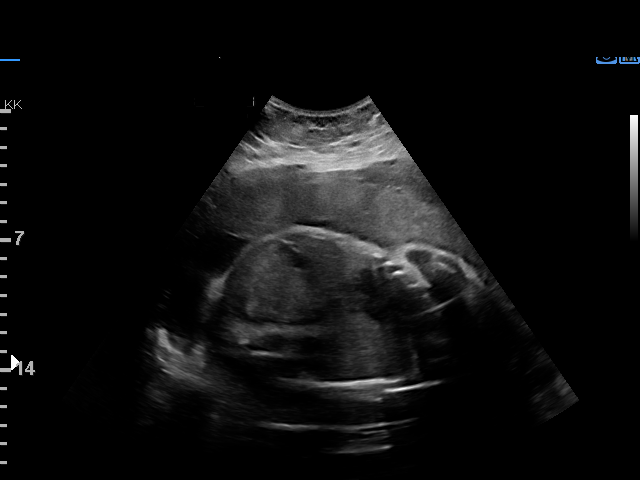
[im 25/31]
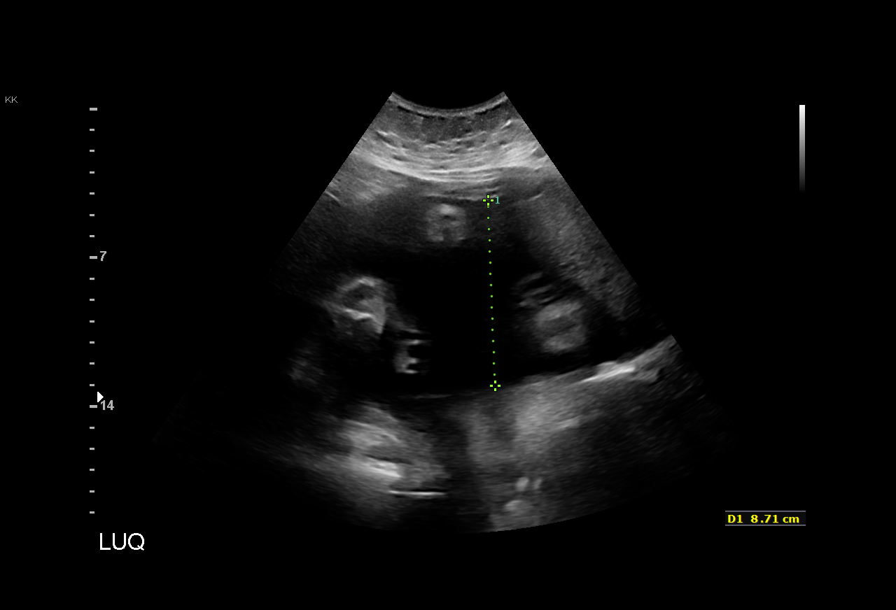
[im 27/31]
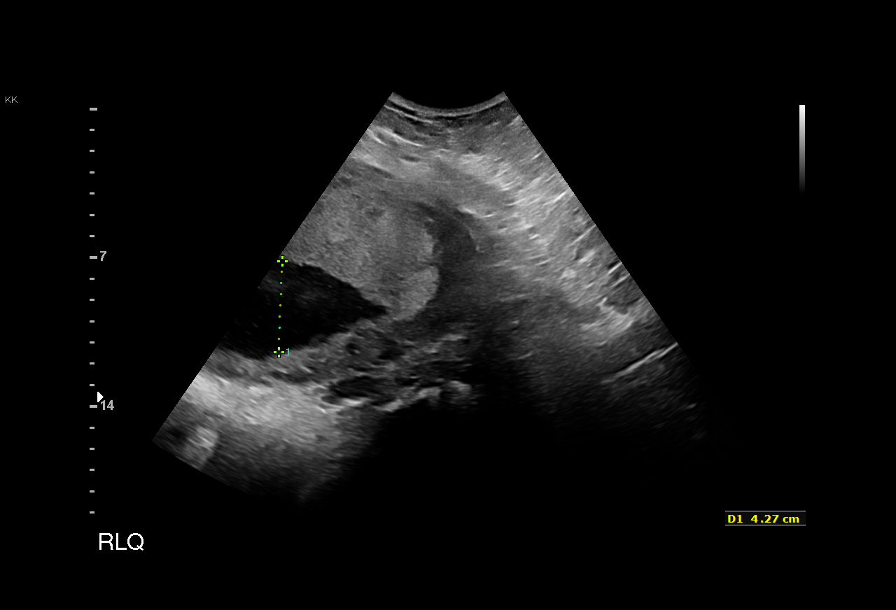
[im 29/31]
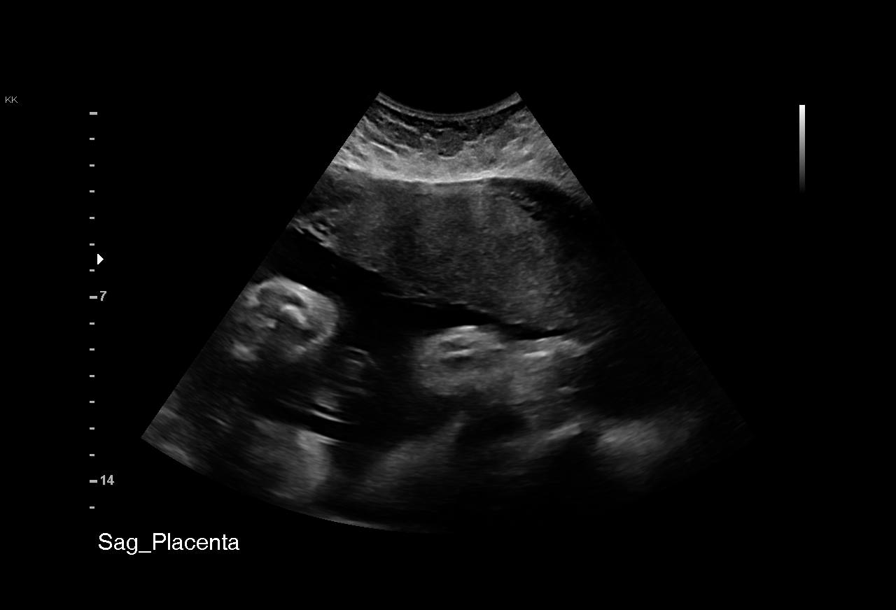

[12 of 28 positions shown; findings below may reference images not displayed]

Ave[facility][HOSPITAL]

     W/NONSTRESS
 ----------------------------------------------------------------------

 ----------------------------------------------------------------------
Indications

  Advanced maternal age multigravida 37,
  second trimester
  Tobacco use complicating pregnancy, third
  trimester
  Obesity complicating pregnancy, second
  trimester (pregravid BMI 42)
  Hypertension - Chronic/Pre-existing
  Polyhydramnios, third trimester, antepartum
  condition or complication, unspecified fetus
  32 weeks gestation of pregnancy
 ----------------------------------------------------------------------
Vital Signs

 BMI:
Fetal Evaluation

 Num Of Fetuses:         1
 Fetal Heart Rate(bpm):  173
 Cardiac Activity:       Observed
 Presentation:           Cephalic
 Placenta:               Anterior
 P. Cord Insertion:      Marginal insertion

 Amniotic Fluid
 AFI FV:      Polyhydramnios

 AFI Sum(cm)     %Tile       Largest Pocket(cm)
 28.73           > 97
 RUQ(cm)       RLQ(cm)       LUQ(cm)        LLQ(cm)

Biophysical Evaluation

 Amniotic F.V:   Pocket => 2 cm two         F. Tone:        Observed
                 planes
 F. Movement:    Observed                   N.S.T:          Reactive
 F. Breathing:   Observed                   Score:          [DATE]
OB History

 Gravidity:    4         Term:   0        Prem:   0        SAB:   4
 TOP:          0       Ectopic:  3        Living: 0
Gestational Age

 LMP:           32w 6d        Date:  05/20/18                 EDD:   02/24/19
 Best:          32w 6d     Det. By:  LMP  (05/20/18)          EDD:   02/24/19
Impression

 Chronic hypertension. Well-controlled on Procardia. Patient
 had ultrasound at your office that showed polyhydramnios.
 She is here for antenatal testing.
 Amniotic fluid is increased and the AFI is 29 cm (mild
 polyhydramnios). Good fetal activity is seen. Antenatal testing
 is reassuring. NST is reactive. BPP [DATE].
 We reassured the patient of the findings.
Recommendations

 -Continue weekly antenatal testing at your office.
                 Kue, [place]

## 2019-04-10 ENCOUNTER — Other Ambulatory Visit: Payer: Self-pay | Admitting: Obstetrics and Gynecology

## 2019-04-10 ENCOUNTER — Other Ambulatory Visit (HOSPITAL_COMMUNITY)
Admission: RE | Admit: 2019-04-10 | Discharge: 2019-04-10 | Disposition: A | Discharge status: Home / Self Care | Payer: BLUE CROSS/BLUE SHIELD | Source: Ambulatory Visit | Attending: Obstetrics and Gynecology | Admitting: Obstetrics and Gynecology

## 2019-04-10 DIAGNOSIS — Z124 Encounter for screening for malignant neoplasm of cervix: Secondary | ICD-10-CM | POA: Insufficient documentation

## 2019-04-11 LAB — CYTOLOGY - PAP
Diagnosis: NEGATIVE
HPV: NOT DETECTED

## 2019-06-28 ENCOUNTER — Telehealth: Payer: Self-pay | Admitting: Internal Medicine

## 2019-06-28 ENCOUNTER — Other Ambulatory Visit: Payer: Self-pay

## 2019-06-28 DIAGNOSIS — Z20822 Contact with and (suspected) exposure to covid-19: Secondary | ICD-10-CM

## 2019-06-28 NOTE — Telephone Encounter (Signed)
lvm for pt to call back to set up for virtual appt

## 2019-06-28 NOTE — Telephone Encounter (Signed)
Copied from [place] [phone]. Topic: Appointment Scheduling - Scheduling Inquiry for Clinic >> Jun 28, 2019  9:37 AM Reyne Dumas L wrote: Reason for CRM:   Pt calling to get a COVID test set up - states that work is requesting she have this done.  No symptoms, no exposure.

## 2019-06-29 LAB — NOVEL CORONAVIRUS, NAA: SARS-CoV-2, NAA: NOT DETECTED

## 2019-07-06 ENCOUNTER — Other Ambulatory Visit: Payer: Self-pay | Admitting: Internal Medicine

## 2019-09-12 ENCOUNTER — Other Ambulatory Visit: Payer: Self-pay | Admitting: Internal Medicine

## 2019-09-12 ENCOUNTER — Other Ambulatory Visit: Payer: Self-pay

## 2019-09-12 ENCOUNTER — Telehealth (INDEPENDENT_AMBULATORY_CARE_PROVIDER_SITE_OTHER): Payer: BLUE CROSS/BLUE SHIELD | Admitting: Internal Medicine

## 2019-09-12 ENCOUNTER — Encounter: Payer: Self-pay | Admitting: Internal Medicine

## 2019-09-12 DIAGNOSIS — Z720 Tobacco use: Secondary | ICD-10-CM

## 2019-09-12 DIAGNOSIS — I10 Essential (primary) hypertension: Secondary | ICD-10-CM

## 2019-09-12 DIAGNOSIS — Z79899 Other long term (current) drug therapy: Secondary | ICD-10-CM

## 2019-09-12 MED ORDER — NIFEDIPINE ER OSMOTIC RELEASE 30 MG PO TB24: ORAL_TABLET | ORAL | 1 refills | Status: DC

## 2019-09-12 NOTE — Progress Notes (Signed)
Virtual Visit via Video Note  I connected with@ on 09/12/19 at  3:30 PM EDT by a video enabled telemedicine application and verified that I am speaking with the correct person using two identifiers. Location patient: work  [employer] office Persons participating in the virtual visit: patient, provider  WIth national recommendations  regarding COVID 19 pandemic   video visit is advised over in office visit for this patient.  Patient aware  of the limitations of evaluation and management by telemedicine and  availability of in person appointments. and agreed to proceed.   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   began smoking when got ou to hospital again   Not nursing.   ROS: See pertinent positives and negatives per HPI.  Past Medical History:  Diagnosis Date  . Allergic rhinitis   . AMA (advanced maternal age) primigravida 64+   . Fibroid   . History of ectopic pregnancy    X3   last one 07/ 2015  (received MTX injection)  . Hydrosalpinx    LEFT  . Hypertension   . Pelvic adhesions   . Wears glasses     Past Surgical History:  Procedure Laterality Date  . CESAREAN SECTION N/A 02/14/2019   Procedure: CESAREAN SECTION;  Surgeon: Thurnell Lose, MD;  Location: [facility];  Service: Obstetrics;  Laterality: N/A;  . CHROMOPERTUBATION Bilateral 11/07/2014   Procedure: CHROMOPERTUBATION;  Surgeon: Governor Specking, MD;  Location: [facility];  Service: Gynecology;  Laterality: Bilateral;  . LAPAROSCOPIC LYSIS OF ADHESIONS N/A 11/07/2014   Procedure: LAPAROSCOPIC LYSIS OF ADHESIONS AND FIMBRIOPLASTY;  Surgeon:  Governor Specking, MD;  Location: [facility];  Service: Gynecology;  Laterality: N/A;  . WISDOM TOOTH EXTRACTION      Family History  Problem Relation Age of Onset  . Liver disease Father   . Asthma Sister   . Asthma Sister   . Anesthesia problems Other     Social History   Tobacco Use  . Smoking status: Former Smoker    Packs/day: 0.25    Years: 10.00    Pack years: 2.50    Types: Cigarettes    Quit date: 11/01/2018    Years since quitting: 0.8  . Smokeless tobacco: Never Used  Substance Use Topics  . Alcohol use: No    Alcohol/week: 0.0 standard drinks  . Drug use: No      Current Outpatient Medications:  .  acetaminophen (TYLENOL) 500 MG tablet, Take 1,000 mg by mouth 4 (four) times daily as needed for headache. , Disp: , Rfl:  .  ibuprofen (ADVIL,MOTRIN) 100 MG/5ML suspension, Take 30 mLs (600 mg total) by mouth every 6 (six) hours as needed for mild pain or moderate pain (cramping)., Disp: 30 mL, Rfl: 1 .  NIFEdipine (PROCARDIA-XL/NIFEDICAL-XL) 30 MG 24 hr tablet, TAKE 1 TABLET(30 MG) BY MOUTH DAILY, Disp: 90 tablet, Rfl: 1 .  Prenatal Vit w/Fe-Methylfol-FA (PNV PO), Take 1 tablet by mouth daily. , Disp: , Rfl:  EXAM: BP Readings from Last 3 Encounters:  02/16/19 137/81  01/26/19 140/79  01/05/19 (!) 132/94    VITALS per patient if applicable:  GENERAL: alert, oriented, appears well and in no acute distress  HEENT: atraumatic, conjunttiva clear, no obvious abnormalities on inspection of external nose and ears  NECK: normal movements of the head and neck  LUNGS: on inspection no signs of respiratory distress, breathing rate appears normal, no obvious gross SOB, gasping or wheezing  CV: no obvious cyanosis    PSYCH/NEURO: pleasant and cooperative, no obvious depression or anxiety, speech and thought processing grossly intact Lab Results  Component Value Date   WBC 16.6 (H) 02/15/2019   HGB 9.1 (L) 02/15/2019   HCT 27.1 (L) 02/15/2019    PLT 241 02/15/2019   GLUCOSE 82 02/13/2019   CHOL 163 05/24/2018   TRIG 44.0 05/24/2018   HDL 45.80 05/24/2018   LDLCALC 108 (H) 05/24/2018   ALT 22 02/13/2019   AST 19 02/13/2019   NA 131 (L) 02/13/2019   K 3.6 02/13/2019   CL 105 02/13/2019   CREATININE 0.73 02/15/2019   BUN <5 (L) 02/13/2019   CO2 21 (L) 02/13/2019   TSH 2.29 05/24/2018   INR 1.00 07/08/2017   HGBA1C 5.4 06/23/2013    ASSESSMENT AND PLAN:  Discussed the following assessment and plan:    ICD-10-CM   1. Essential hypertension  I10   2. Medication management  Z79.899   3. Tobacco user  Z72.0    Refill med  Advise stop tobacco again  Plan cpx in 3-5 mos  In person  Or as needed  Counseled.  congrats on  Baby girl...  Expectant management and discussion of plan and treatment with opportunity to ask questions and all were answered. The patient agreed with the plan and demonstrated an understanding of the instructions.   Advised to call back or seek an in-person evaluation if worsening  or having  further concerns .  Shanon Ace, MD

## 2020-04-19 ENCOUNTER — Other Ambulatory Visit: Payer: Self-pay | Admitting: Internal Medicine

## 2020-05-22 ENCOUNTER — Other Ambulatory Visit: Payer: Self-pay | Admitting: Obstetrics and Gynecology

## 2020-11-24 ENCOUNTER — Other Ambulatory Visit: Payer: Self-pay | Admitting: Internal Medicine

## 2020-12-30 ENCOUNTER — Other Ambulatory Visit: Payer: Self-pay | Admitting: Internal Medicine

## 2020-12-31 NOTE — Progress Notes (Signed)
Chief Complaint  Patient presents with  . Follow-up    BP    HPI: Shirley Alvarez 40 y.o. come in for meds and Chronic disease management last visit with me  About HT was over a year ago  Feels doing well and bp controlled but ran out  Of med nifedipine Reports  Below 130/80 range   Se  Headaches   Often in am  Take med at night  Thinking of another pregnancy  So doesn't want to change meds  BP: was better with pregnancy .  March 20 20 .    Both caretaker.  TObacco  1pack per week.  Emergency  c section.  No preec;lampsi .  ? OSA sleep easily but works a lot  Falls asleep ? If  Snoring  works Over 40  Hours . Child awakens every night  Last visit 10 20  120/ 130/ 70 80  Wt Readings from Last 3 Encounters:  01/01/21 244 lb 12.8 oz (111 kg)  02/13/19 225 lb (102.1 kg)  01/26/19 227 lb 4.8 oz (103.1 kg)    ROS: See pertinent positives and negatives per HPI. No cp sob  Syncope edema   Past Medical History:  Diagnosis Date  . Allergic rhinitis   . AMA (advanced maternal age) primigravida 41+   . Fibroid   . History of ectopic pregnancy    X3   last one 07/ 2015  (received MTX injection)  . Hydrosalpinx    LEFT  . Hypertension   . Pelvic adhesions   . Wears glasses     Family History  Problem Relation Age of Onset  . Liver disease Father   . Asthma Sister   . Asthma Sister   . Anesthesia problems Other     Social History   Socioeconomic History  . Marital status: Married    Spouse name: Not on file  . Number of children: Not on file  . Years of education: Not on file  . Highest education level: Not on file  Occupational History  . Not on file  Tobacco Use  . Smoking status: Former Smoker    Packs/day: 0.25    Years: 10.00    Pack years: 2.50    Types: Cigarettes    Quit date: 11/01/2018    Years since quitting: 2.1  . Smokeless tobacco: Never Used  Substance and Sexual Activity  . Alcohol use: No    Alcohol/week: 0.0 standard drinks  . Drug  use: No  . Sexual activity: Yes    Birth control/protection: None  Other Topics Concern  . 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 acetaminophen (TYLENOL) 500 MG tablet Take 1,000 mg by mouth 4 (four) times daily as needed for headache.    . ibuprofen (ADVIL,MOTRIN) 100 MG/5ML suspension Take 30 mLs (600 mg total) by mouth every 6 (six) hours as needed for mild pain or moderate pain (cramping). 30 mL 1  . NIFEdipine (PROCARDIA-XL/NIFEDICAL-XL) 30 MG 24 hr tablet TAKE 1 TABLET(30 MG) BY MOUTH DAILY--Schedule office  visit for further refills. [id number] 30 tablet 0  . Prenatal Vit w/Fe-Methylfol-FA (PNV PO) Take 1 tablet by mouth daily.  (Patient not taking: Reported on 01/01/2021)     No facility-administered medications prior to visit.     EXAM:  BP (!) 170/100 (BP Location: Right Arm, Patient Position: Sitting, Cuff Size: Normal)   Pulse 75   Temp 98.7 F (37.1 C) (Oral)   Ht 5\' 2"  (1.575 m)   Wt 244 lb 12.8 oz (111 kg)   SpO2 99%   BMI 44.77 kg/m   Body mass index is 44.77 kg/m.  GENERAL: vitals reviewed and listed above, alert, oriented, appears well hydrated and in no acute distress HEENT: atraumatic, conjunctiva  clear, no obvious abnormalities on inspection of external nose and ears OP :masked   NECK: no obvious masses on inspection palpation  LUNGS: clear to auscultation bilaterally, no wheezes, rales or rhonchi, good air movement CV: HRRR, no clubbing cyanosis or  peripheral edema nl cap refill  MS: moves all extremities without noticeable focal  Abnormality Abdomen:   Sof,t normal bowel sounds without hepatosplenomegaly, no guarding rebound or masses no CVA tenderness PSYCH: pleasant and cooperative, no obvious depression or anxiety Lab Results  Component Value Date   WBC 10.6 (H) 01/01/2021   HGB 11.7 (L) 01/01/2021   HCT 36.0 01/01/2021   PLT 403.0 (H) 01/01/2021   GLUCOSE 79 01/01/2021   CHOL 152 01/01/2021   TRIG 56.0 01/01/2021   HDL 43.90 01/01/2021   LDLCALC 97 01/01/2021   ALT 14 01/01/2021   AST 13 01/01/2021   NA 135 01/01/2021   K 4.0 01/01/2021   CL 105 01/01/2021   CREATININE 0.85 01/01/2021   BUN 11 01/01/2021   CO2 26 01/01/2021   TSH 2.75 01/01/2021   INR 1.00 07/08/2017   HGBA1C 5.6 01/01/2021   BP Readings from Last 3 Encounters:  01/01/21 (!) 170/100  02/16/19 137/81  01/26/19 140/79    ASSESSMENT AND PLAN:  Discussed the following assessment and plan:  Essential hypertension - Plan: Basic metabolic panel, CBC with Differential/Platelet, Hemoglobin A1c, Hepatic function panel, Lipid panel, TSH, Basic metabolic panel, CBC with Differential/Platelet, Hemoglobin A1c, Hepatic function panel, Lipid panel, TSH  Medication management - Plan: Basic metabolic panel, CBC with Differential/Platelet, Hemoglobin A1c, Hepatic function panel, Lipid panel, TSH, Basic metabolic panel, CBC with Differential/Platelet, Hemoglobin A1c, Hepatic function panel, Lipid panel, TSH  Tobacco user - Plan: Basic metabolic panel, CBC with Differential/Platelet, Hemoglobin A1c, Hepatic function panel, Lipid panel, TSH, Basic metabolic panel, CBC with Differential/Platelet, Hemoglobin A1c, Hepatic function panel, Lipid panel, TSH  BMI 40.0-44.9, adult (HCC) - Plan: Basic metabolic panel, CBC with Differential/Platelet, Hemoglobin A1c, Hepatic function panel, Lipid panel, TSH, Ambulatory referral to Pulmonology, Basic metabolic panel, CBC with Differential/Platelet, Hemoglobin A1c, Hepatic function panel, Lipid panel, TSH  Daytime sleepiness - Plan:  Ambulatory referral to Pulmonology Needs bp control says    Pt says controlled on low dose meds  But  May be rebound   Elevation today from being off.   Needs fu to ensure control  Wants  To stay on  meds compatible with pregnancy  Advised still stop tobacco  Work on weight loss   Lab today  Can refer for eval for poss osa   With ht has  And daytime drowsiness but could be ls also  -Patient advised to return or notify health care team  if  new concerns arise. In iterim.  Patient Instructions  Intensify lifestyle  interventions.   Of course stop tobacco  As discussed on past.   BP goal below  Or about 130/80 average .  After restarting medication   Take blood pressure readings twice a day for 5-7 days and record .     Take 2 -3 readings at each sitting .   Can send in readings  by My Chart.     Before checking your blood pressure make sure: You are seated and quite for 5 min before checking Feet are flat on the floor Siting in chair with your back supported straight up and down Arm resting on table or arm of chair at heart level Bladder is empty You have NOT had caffeine or tobacco within the last 30 min  [email or web address]  Will do  Sleep referral  About ? Of sleep apnea .      Standley Brooking. Bartolo Montanye M.D.

## 2021-01-01 ENCOUNTER — Encounter: Payer: Self-pay | Admitting: Internal Medicine

## 2021-01-01 ENCOUNTER — Other Ambulatory Visit: Payer: Self-pay

## 2021-01-01 ENCOUNTER — Ambulatory Visit: Payer: 59 | Admitting: Internal Medicine

## 2021-01-01 VITALS — BP 170/100 | HR 75 | Temp 98.7°F | Ht 62.0 in | Wt 244.8 lb

## 2021-01-01 DIAGNOSIS — Z6841 Body Mass Index (BMI) 40.0 and over, adult: Secondary | ICD-10-CM | POA: Diagnosis not present

## 2021-01-01 DIAGNOSIS — Z720 Tobacco use: Secondary | ICD-10-CM | POA: Diagnosis not present

## 2021-01-01 DIAGNOSIS — R400 Somnolence: Secondary | ICD-10-CM

## 2021-01-01 DIAGNOSIS — Z79899 Other long term (current) drug therapy: Secondary | ICD-10-CM

## 2021-01-01 DIAGNOSIS — I10 Essential (primary) hypertension: Secondary | ICD-10-CM | POA: Diagnosis not present

## 2021-01-01 LAB — CBC WITH DIFFERENTIAL/PLATELET
Basophils Absolute: 0.1 10*3/uL (ref 0.0–0.1)
Basophils Relative: 0.6 % (ref 0.0–3.0)
Eosinophils Absolute: 0.3 10*3/uL (ref 0.0–0.7)
Eosinophils Relative: 2.5 % (ref 0.0–5.0)
HCT: 36 % (ref 36.0–46.0)
Hemoglobin: 11.7 g/dL — ABNORMAL LOW (ref 12.0–15.0)
Lymphocytes Relative: 32.1 % (ref 12.0–46.0)
Lymphs Abs: 3.4 10*3/uL (ref 0.7–4.0)
MCHC: 32.5 g/dL (ref 30.0–36.0)
MCV: 78.4 fl (ref 78.0–100.0)
Monocytes Absolute: 0.8 10*3/uL (ref 0.1–1.0)
Monocytes Relative: 7.1 % (ref 3.0–12.0)
Neutro Abs: 6.1 10*3/uL (ref 1.4–7.7)
Neutrophils Relative %: 57.7 % (ref 43.0–77.0)
Platelets: 403 10*3/uL — ABNORMAL HIGH (ref 150.0–400.0)
RBC: 4.59 Mil/uL (ref 3.87–5.11)
RDW: 17.3 % — ABNORMAL HIGH (ref 11.5–15.5)
WBC: 10.6 10*3/uL — ABNORMAL HIGH (ref 4.0–10.5)

## 2021-01-01 LAB — BASIC METABOLIC PANEL
BUN: 11 mg/dL (ref 6–23)
CO2: 26 mEq/L (ref 19–32)
Calcium: 9.7 mg/dL (ref 8.4–10.5)
Chloride: 105 mEq/L (ref 96–112)
Creatinine, Ser: 0.85 mg/dL (ref 0.40–1.20)
GFR: 86.37 mL/min (ref >60.00)
Glucose, Bld: 79 mg/dL (ref 70–99)
Potassium: 4 mEq/L (ref 3.5–5.1)
Sodium: 135 mEq/L (ref 135–145)

## 2021-01-01 LAB — HEPATIC FUNCTION PANEL
ALT: 14 U/L (ref 0–35)
AST: 13 U/L (ref 0–37)
Albumin: 4.3 g/dL (ref 3.5–5.2)
Alkaline Phosphatase: 80 U/L (ref 39–117)
Bilirubin, Direct: 0.1 mg/dL (ref 0.0–0.3)
Total Bilirubin: 0.3 mg/dL (ref 0.2–1.2)
Total Protein: 6.8 g/dL (ref 6.0–8.3)

## 2021-01-01 LAB — LIPID PANEL
Cholesterol: 152 mg/dL (ref 0–200)
HDL: 43.9 mg/dL (ref >39.00)
LDL Cholesterol: 97 mg/dL (ref 0–99)
NonHDL: 108.23
Total CHOL/HDL Ratio: 3
Triglycerides: 56 mg/dL (ref 0.0–149.0)
VLDL: 11.2 mg/dL (ref 0.0–40.0)

## 2021-01-01 LAB — HEMOGLOBIN A1C: Hgb A1c MFr Bld: 5.6 % (ref 4.6–6.5)

## 2021-01-01 LAB — TSH: TSH: 2.75 u[IU]/mL (ref 0.35–4.50)

## 2021-01-01 MED ORDER — NIFEDIPINE ER OSMOTIC RELEASE 30 MG PO TB24: ORAL_TABLET | ORAL | 1 refills | Status: DC

## 2021-01-01 NOTE — Patient Instructions (Addendum)
Intensify lifestyle interventions.   Of course stop tobacco  As discussed on past.   BP goal below  Or about 130/80 average .  After restarting medication   Take blood pressure readings twice a day for 5-7 days and record .     Take 2 -3 readings at each sitting .   Can send in readings  by My Chart.     Before checking your blood pressure make sure: You are seated and quite for 5 min before checking Feet are flat on the floor Siting in chair with your back supported straight up and down Arm resting on table or arm of chair at heart level Bladder is empty You have NOT had caffeine or tobacco within the last 30 min  [email or web address]  Will do  Sleep referral  About ? Of sleep apnea .

## 2021-01-02 NOTE — Progress Notes (Signed)
Mild anemia   may be iron deficiency  blood sugar normal ,and cholesterol in range . Kidney function liver testst normal, slight elevation of wbc probably  from tobacco use. Consider adding  women's iron supplement .   You should be notified about  sleep pulmonary referral as we discussed .

## 2021-02-05 ENCOUNTER — Institutional Professional Consult (permissible substitution): Payer: 59 | Admitting: Pulmonary Disease

## 2021-03-10 ENCOUNTER — Encounter: Payer: Self-pay | Admitting: Pulmonary Disease

## 2021-03-10 ENCOUNTER — Ambulatory Visit (INDEPENDENT_AMBULATORY_CARE_PROVIDER_SITE_OTHER): Payer: 59 | Admitting: Pulmonary Disease

## 2021-03-10 ENCOUNTER — Other Ambulatory Visit: Payer: Self-pay

## 2021-03-10 VITALS — BP 120/84 | HR 85 | Temp 97.6°F | Ht 61.0 in | Wt 235.4 lb

## 2021-03-10 DIAGNOSIS — Z720 Tobacco use: Secondary | ICD-10-CM | POA: Diagnosis not present

## 2021-03-10 DIAGNOSIS — G4733 Obstructive sleep apnea (adult) (pediatric): Secondary | ICD-10-CM | POA: Diagnosis not present

## 2021-03-10 DIAGNOSIS — R0683 Snoring: Secondary | ICD-10-CM

## 2021-03-10 NOTE — Patient Instructions (Signed)
Home sleep study 

## 2021-03-10 NOTE — Assessment & Plan Note (Signed)
Given excessive daytime somnolence, narrow pharyngeal exam, witnessed apneas & loud snoring, obstructive sleep apnea is very likely & an overnight polysomnogram will be scheduled as a home study. The pathophysiology of obstructive sleep apnea , it's cardiovascular consequences & modes of treatment including CPAP were discused with the patient in detail & they evidenced understanding.   Pretest probably it is high.  She would be willing to use CPAP as needed.  Nasal cradle mask may suffice. I am hopeful that we should be able to help her with her headaches, these do not seem to be related to hypertension

## 2021-03-10 NOTE — Progress Notes (Signed)
Subjective:    Patient ID: Shirley Alvarez, female    DOB: [birth year], 40 y.o.   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 She has a history of morning headaches and multiple miscarriages. Loud snoring has been noted by her husband and other family members and he has witnessed occasional apneas. She reports waking up with frontal and bitemporal headaches daily for at least 2 years, she takes Tylenol on a daily basis, headache subside within 2 hours of medication. Sleepiness score is 12 and she reports sleepiness while lying down to rest in the afternoons, sitting inactive in a public place or as a passenger in a car. Bedtime is between 10 PM and midnight, sleep latency is minimal can occasionally last up to an hour, TV stays on until it is turned off, reports 1-2 nocturnal awakenings including nocturia, sleeps on the left side with 1 pillow, out of bed at 6 AM feeling tired with headaches, no dryness of mouth. 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 Allergic rhinitis   . AMA (advanced maternal age) primigravida 38+   . Asthma   . Fibroid   . History of ectopic pregnancy    X3   last one 07/ 2015  (received MTX injection)  . Hydrosalpinx    LEFT  . Hypertension   . Pelvic adhesions   . Wears glasses    Past Surgical History:  Procedure Laterality Date  . CESAREAN SECTION N/A 02/14/2019   Procedure: CESAREAN SECTION;  Surgeon: Thurnell Lose, MD;  Location: [facility];  Service: Obstetrics;  Laterality: N/A;  . CHROMOPERTUBATION Bilateral 11/07/2014   Procedure: CHROMOPERTUBATION;  Surgeon: Governor Specking, MD;   Location: [facility];  Service: Gynecology;  Laterality: Bilateral;  . LAPAROSCOPIC LYSIS OF ADHESIONS N/A 11/07/2014   Procedure: LAPAROSCOPIC LYSIS OF ADHESIONS AND FIMBRIOPLASTY;  Surgeon: Governor Specking, MD;  Location: [facility];  Service: Gynecology;  Laterality: N/A;  . WISDOM TOOTH EXTRACTION      No Known Allergies  Social History   Socioeconomic History  . Marital status: Married    Spouse name: Not on file  . Number of children: Not on file  . Years of education: Not on file  . Highest education level: Not on file  Occupational History  . Not on file  Tobacco Use  . Smoking status: Current Every Day Smoker    Packs/day: 0.25    Years: 19.00    Pack years: 4.75    Types: Cigarettes  . Smokeless tobacco: Never Used  Vaping Use  . Vaping Use: Never used  Substance and Sexual Activity  . Alcohol use: No    Alcohol/week: 0.0 standard drinks  . Drug use: No  . Sexual activity: Yes    Birth control/protection: None  Other Topics Concern  . 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 Liver disease Father   . Asthma Sister   . Asthma Sister   . Anesthesia problems Other       Review of Systems Constitutional: negative for anorexia, fevers and sweats  Eyes: negative for irritation, redness and visual disturbance  Ears, nose, mouth, throat, and face: negative for earaches, epistaxis, nasal congestion and sore throat  Respiratory: negative for cough, sputum and wheezing  Cardiovascular: negative for  lower extremity  edema, orthopnea, palpitations and syncope  Gastrointestinal: negative for abdominal pain, constipation, diarrhea, melena, nausea and vomiting  Genitourinary:negative for dysuria, frequency and hematuria  Hematologic/lymphatic: negative for bleeding, easy bruising and lymphadenopathy  Musculoskeletal:negative for arthralgias, muscle weakness and stiff joints  Neurological: negative for coordination problems, gait problems and weakness  Endocrine: negative for diabetic symptoms including polydipsia, polyuria and weight loss     Objective:   Physical Exam        Assessment & Plan:

## 2021-03-10 NOTE — Assessment & Plan Note (Signed)
Smoking cessation was emphasized is the most important intervention.  A formal diagnosis of asthma has not been made

## 2021-03-21 ENCOUNTER — Ambulatory Visit: Payer: 59

## 2021-03-21 ENCOUNTER — Other Ambulatory Visit: Payer: Self-pay

## 2021-03-21 DIAGNOSIS — R0683 Snoring: Secondary | ICD-10-CM

## 2021-03-21 DIAGNOSIS — G4733 Obstructive sleep apnea (adult) (pediatric): Secondary | ICD-10-CM

## 2021-03-25 ENCOUNTER — Telehealth: Payer: Self-pay | Admitting: Pulmonary Disease

## 2021-03-25 DIAGNOSIS — G4733 Obstructive sleep apnea (adult) (pediatric): Secondary | ICD-10-CM

## 2021-03-25 NOTE — Telephone Encounter (Signed)
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

## 2021-03-27 NOTE — Telephone Encounter (Signed)
Called and left message on voicemail to please return phone call to go over HST results. Contact number provided.  

## 2021-04-01 NOTE — Telephone Encounter (Signed)
Called and left message on voicemail to please return phone call to go over HST results. Contact number provided. Letter sent due to multiple attempts to reach patient.

## 2021-04-07 ENCOUNTER — Telehealth: Payer: Self-pay | Admitting: Pulmonary Disease

## 2021-04-07 NOTE — Telephone Encounter (Signed)
Called and spoke with patient. She was made aware of her results and verbalized understanding. I explained to her the process of getting a cpap machine. She wants to take some time to think and discuss the cpap with her husband. She will call our office back once she has made an decision.   Nothing further needed at time of call.

## 2021-04-07 NOTE — Telephone Encounter (Signed)
See telephone note from today 04/07/21

## 2021-09-08 ENCOUNTER — Encounter: Payer: Self-pay | Admitting: Internal Medicine

## 2021-09-08 ENCOUNTER — Other Ambulatory Visit: Payer: Self-pay

## 2021-09-08 ENCOUNTER — Ambulatory Visit: Payer: 59 | Admitting: Internal Medicine

## 2021-09-08 VITALS — BP 130/80 | HR 95 | Temp 97.9°F | Ht 61.0 in | Wt 236.4 lb

## 2021-09-08 DIAGNOSIS — I10 Essential (primary) hypertension: Secondary | ICD-10-CM

## 2021-09-08 DIAGNOSIS — G4733 Obstructive sleep apnea (adult) (pediatric): Secondary | ICD-10-CM | POA: Diagnosis not present

## 2021-09-08 DIAGNOSIS — M79604 Pain in right leg: Secondary | ICD-10-CM | POA: Diagnosis not present

## 2021-09-08 DIAGNOSIS — M549 Dorsalgia, unspecified: Secondary | ICD-10-CM | POA: Diagnosis not present

## 2021-09-08 DIAGNOSIS — Z8759 Personal history of other complications of pregnancy, childbirth and the puerperium: Secondary | ICD-10-CM

## 2021-09-08 DIAGNOSIS — Z79899 Other long term (current) drug therapy: Secondary | ICD-10-CM

## 2021-09-08 LAB — BASIC METABOLIC PANEL
BUN: 12 mg/dL (ref 6–23)
CO2: 24 mEq/L (ref 19–32)
Calcium: 10.5 mg/dL (ref 8.4–10.5)
Chloride: 103 mEq/L (ref 96–112)
Creatinine, Ser: 0.92 mg/dL (ref 0.40–1.20)
GFR: 78.17 mL/min (ref >60.00)
Glucose, Bld: 90 mg/dL (ref 70–99)
Potassium: 4.3 mEq/L (ref 3.5–5.1)
Sodium: 133 mEq/L — ABNORMAL LOW (ref 135–145)

## 2021-09-08 LAB — CBC WITH DIFFERENTIAL/PLATELET
Basophils Absolute: 0.1 10*3/uL (ref 0.0–0.1)
Basophils Relative: 0.5 % (ref 0.0–3.0)
Eosinophils Absolute: 0.3 10*3/uL (ref 0.0–0.7)
Eosinophils Relative: 2.9 % (ref 0.0–5.0)
HCT: 40 % (ref 36.0–46.0)
Hemoglobin: 12.7 g/dL (ref 12.0–15.0)
Lymphocytes Relative: 29 % (ref 12.0–46.0)
Lymphs Abs: 3.1 10*3/uL (ref 0.7–4.0)
MCHC: 31.8 g/dL (ref 30.0–36.0)
MCV: 78.5 fl (ref 78.0–100.0)
Monocytes Absolute: 0.9 10*3/uL (ref 0.1–1.0)
Monocytes Relative: 8.1 % (ref 3.0–12.0)
Neutro Abs: 6.3 10*3/uL (ref 1.4–7.7)
Neutrophils Relative %: 59.5 % (ref 43.0–77.0)
Platelets: 457 10*3/uL — ABNORMAL HIGH (ref 150.0–400.0)
RBC: 5.09 Mil/uL (ref 3.87–5.11)
RDW: 17 % — ABNORMAL HIGH (ref 11.5–15.5)
WBC: 10.6 10*3/uL — ABNORMAL HIGH (ref 4.0–10.5)

## 2021-09-08 LAB — POCT URINALYSIS DIPSTICK
Bilirubin, UA: NEGATIVE
Blood, UA: NEGATIVE
Glucose, UA: NEGATIVE
Ketones, UA: NEGATIVE
Leukocytes, UA: NEGATIVE
Nitrite, UA: NEGATIVE
Protein, UA: POSITIVE — AB
Spec Grav, UA: >=1.03 — AB (ref 1.010–1.025)
Urobilinogen, UA: 0.2 E.U./dL
pH, UA: 6 (ref 5.0–8.0)

## 2021-09-08 LAB — HEPATIC FUNCTION PANEL
ALT: 18 U/L (ref 0–35)
AST: 18 U/L (ref 0–37)
Albumin: 4.5 g/dL (ref 3.5–5.2)
Alkaline Phosphatase: 78 U/L (ref 39–117)
Bilirubin, Direct: 0.1 mg/dL (ref 0.0–0.3)
Total Bilirubin: 0.3 mg/dL (ref 0.2–1.2)
Total Protein: 7.6 g/dL (ref 6.0–8.3)

## 2021-09-08 LAB — SEDIMENTATION RATE: Sed Rate: 35 mm/hr — ABNORMAL HIGH (ref 0–20)

## 2021-09-08 LAB — HCG, QUANTITATIVE, PREGNANCY: Quantitative HCG: <0.6 m[IU]/mL

## 2021-09-08 LAB — C-REACTIVE PROTEIN: CRP: 1.9 mg/dL (ref 0.5–20.0)

## 2021-09-08 NOTE — Patient Instructions (Signed)
Thinking that your right leg knee pain may be a mechanical orthopedic issue and causing the left low back pain. Will check labs as we discussed as a rule out for medical pregnancy causes. Try topical Voltaren or equivalent up to 3-4 times a day on that right knee area  Will order sports medicine consult to help assess right leg pain and left back pain. Healthy weight loss and certain exercises might be helpful for you.  As always advise stopping tobacco.  For health reasons.

## 2021-09-08 NOTE — Progress Notes (Signed)
Chief Complaint  Patient presents with   Follow-up   Back Pain    Patinet complains of lower back pain, x3 weeks, Patient denies any known injury, Tried Tylenol with little relief   Leg Pain    Patient complains of leg pain, x3 weeks, Patient denies any known injury, Tried Tylenol with little relief     HPI: Shirley Alvarez 40 y.o. come in for left back pain and right leg pain HT meds no concerns Now dx of mild osa but sx and advised CPAP Tobacco still 4-5 a day In this setting over the last 3+ weeks she has had right leg pain that seems to be centered around her right knee although sometimes down the lateral calf without any associated swelling injury or change in activity.  Then this week she developed left lower back side pain is concerned to be checked for ectopic pregnancy because of felt like the same when she had back. However periods are on time last 24 July 2025 regular periods. No UTI symptoms or hematuria. No fever redness of joints. No new neurologic symptoms. Side pain  ROS: See pertinent positives and negatives per HPI. She has a 34-year-old at home weighs 30 pounds does do lifting but no specific injury. Past Medical History:  Diagnosis Date   Allergic rhinitis    AMA (advanced maternal age) primigravida 35+    Asthma    Fibroid    History of ectopic pregnancy    X3   last one 07/ 2015  (received MTX injection)   Hydrosalpinx    LEFT   Hypertension    Pelvic adhesions    Wears glasses     Family History  Problem Relation Age of Onset   Liver disease Father    Asthma Sister    Asthma Sister    Anesthesia problems Other     Social History   Socioeconomic History   Marital status: Married    Spouse name: Not on file   Number of children: Not on file   Years of education: Not on file   Highest education level: Not on file  Occupational History   Not on file  Tobacco Use   Smoking status: Every Day    Packs/day: 0.25    Years: 19.00     Pack years: 4.75    Types: Cigarettes   Smokeless tobacco: Never  Vaping Use   Vaping Use: Never used  Substance and Sexual Activity   Alcohol use: No    Alcohol/week: 0.0 standard drinks   Drug use: No   Sexual activity: Yes    Birth control/protection: None  Other Topics Concern   Not on file  Social History Narrative   Early Childhood education   Kindergarten age group      History of miscarriage December 2011   Social Determinants of Health   Financial Resource Strain: Not on file  Food Insecurity: Not on file  Transportation Needs: Not on file  Physical Activity: Not on file  Stress: Not on file  Social Connections: Not on file    Outpatient Medications Prior to Visit  Medication Sig Dispense Refill   acetaminophen (TYLENOL) 500 MG tablet Take 1,000 mg by mouth 4 (four) times daily as needed for headache.     albuterol (VENTOLIN HFA) 108 (90 Base) MCG/ACT inhaler Inhale into the lungs every 6 (six) hours as needed for wheezing or shortness of breath.     ibuprofen (ADVIL,MOTRIN) 100 MG/5ML suspension Take 30 mLs (  600 mg total) by mouth every 6 (six) hours as needed for mild pain or moderate pain (cramping). 30 mL 1   NIFEdipine (PROCARDIA-XL/NIFEDICAL-XL) 30 MG 24 hr tablet TAKE 1 TABLET(30 MG) BY MOUTH DAILY-- 90 tablet 1   Prenatal Vit w/Fe-Methylfol-FA (PNV PO) Take 1 tablet by mouth daily.     No facility-administered medications prior to visit.     EXAM:  BP 130/80 (BP Location: Left Arm, Patient Position: Sitting, Cuff Size: Large)   Pulse 95   Temp 97.9 F (36.6 C) (Oral)   Ht 5\' 1"  (1.549 m)   Wt 236 lb 6.4 oz (107.2 kg)   SpO2 97%   BMI 44.67 kg/m   Body mass index is 44.67 kg/m.  GENERAL: vitals reviewed and listed above, alert, oriented, appears well hydrated and in no acute distress HEENT: atraumatic, conjunctiva  clear, no obvious abnormalities on inspection of external nose and ears OP : Masked NECK: no obvious masses on inspection  palpation  LUNGS: No respiratory distress CV: HRRR, no clubbing cyanosis or  peripheral edema nl cap refill  MS: moves all extremities right knee without effusion but points to this anterior knee is a problem and right lateral lower calf area but no specific swelling redness or warmth. No midline back pain however tenderness in the left paraspinal area gait is within normal limits and normal toe heel walk. PSYCH: pleasant and cooperative, no obvious depression or anxiety Lab Results  Component Value Date   WBC 10.6 (H) 09/08/2021   HGB 12.7 09/08/2021   HCT 40.0 09/08/2021   PLT 457.0 (H) 09/08/2021   GLUCOSE 90 09/08/2021   CHOL 152 01/01/2021   TRIG 56.0 01/01/2021   HDL 43.90 01/01/2021   LDLCALC 97 01/01/2021   ALT 18 09/08/2021   AST 18 09/08/2021   NA 133 (L) 09/08/2021   K 4.3 09/08/2021   CL 103 09/08/2021   CREATININE 0.92 09/08/2021   BUN 12 09/08/2021   CO2 24 09/08/2021   TSH 2.75 01/01/2021   INR 1.00 07/08/2017   HGBA1C 5.6 01/01/2021   BP Readings from Last 3 Encounters:  09/08/21 130/80  03/10/21 120/84  01/01/21 (!) 170/100    ASSESSMENT AND PLAN:  Discussed the following assessment and plan:  Acute left-sided back pain, unspecified back location - Plan: Basic metabolic panel, CBC with Differential/Platelet, Hepatic function panel, HCG, Quant, Pregnancy, Sedimentation rate, C-reactive protein, POCT Urinalysis Dipstick (Automated), POCT urinalysis dipstick, C-reactive protein, Sedimentation rate, HCG, Quant, Pregnancy, Hepatic function panel, CBC with Differential/Platelet, Basic metabolic panel, Ambulatory referral to Sports Medicine  Right leg pain - Plan: Basic metabolic panel, CBC with Differential/Platelet, Hepatic function panel, HCG, Quant, Pregnancy, Sedimentation rate, C-reactive protein, POCT Urinalysis Dipstick (Automated), C-reactive protein, Sedimentation rate, HCG, Quant, Pregnancy, Hepatic function panel, CBC with Differential/Platelet, Basic  metabolic panel, Ambulatory referral to Sports Medicine  Essential hypertension - Plan: Basic metabolic panel, CBC with Differential/Platelet, Hepatic function panel, HCG, Quant, Pregnancy, Sedimentation rate, C-reactive protein, C-reactive protein, Sedimentation rate, HCG, Quant, Pregnancy, Hepatic function panel, CBC with Differential/Platelet, Basic metabolic panel  OSA (obstructive sleep apnea) - Plan: Basic metabolic panel, CBC with Differential/Platelet, Hepatic function panel, HCG, Quant, Pregnancy, Sedimentation rate, C-reactive protein, C-reactive protein, Sedimentation rate, HCG, Quant, Pregnancy, Hepatic function panel, CBC with Differential/Platelet, Basic metabolic panel  Medication management - Plan: Basic metabolic panel, CBC with Differential/Platelet, Hepatic function panel, HCG, Quant, Pregnancy, Sedimentation rate, C-reactive protein, C-reactive protein, Sedimentation rate, HCG, Quant, Pregnancy, Hepatic function panel, CBC with Differential/Platelet, Basic  metabolic panel  History of ectopic pregnancy - Plan: Basic metabolic panel, CBC with Differential/Platelet, Hepatic function panel, HCG, Quant, Pregnancy, Sedimentation rate, C-reactive protein, POCT Urinalysis Dipstick (Automated), C-reactive protein, Sedimentation rate, HCG, Quant, Pregnancy, Hepatic function panel, CBC with Differential/Platelet, Basic metabolic panel ?  This is probably mechanical pain back may be related to the right leg difficulty. Will check hCG because of her past history of ectopic but data will be positive. Plan referral sports medicine.  If lab is unrevealing. -Patient advised to return or notify health care team  if  new concerns arise.  Patient Instructions  Thinking that your right leg knee pain may be a mechanical orthopedic issue and causing the left low back pain. Will check labs as we discussed as a rule out for medical pregnancy causes. Try topical Voltaren or equivalent up to 3-4 times a  day on that right knee area  Will order sports medicine consult to help assess right leg pain and left back pain. Healthy weight loss and certain exercises might be helpful for you.  As always advise stopping tobacco.  For health reasons.   Standley Brooking. Durand Wittmeyer M.D.

## 2021-09-12 NOTE — Progress Notes (Signed)
Neg pregnancy . Rest of labs either normal or stable  minor abnormalities non diagnostic( liver blood sugar and kidney function normal)

## 2021-09-16 NOTE — Progress Notes (Signed)
Benito Mccreedy D.[place] Phone: [phone]   Assessment and Plan:     1. Acute pain of right knee -Acute, self-limited, initial sports medicine visit - Patellofemoral syndrome versus meniscal tear based on HPI, physical exam, unremarkable x-ray - Start HEP for gentle knee exercises.  May call back if she decides that she wants to do PT and we can put in a referral - Start meloxicam 15 mg daily x2 weeks.  May use remainder as needed for pain control - X-ray obtained in clinic.  My interpretation: No acute fracture, no dislocation, no significant cortical irregularities  2.  Low back pain, acute - Likely compensatory from acute knee pain - Start meloxicam 15 mg daily x2 weeks and may use remainder as needed for pain control - No red flag symptoms, so no imaging done today  Pertinent previous records reviewed include PCP note   Follow Up: In 4 weeks for reevaluation.  Would consider ultrasound versus formal PT versus MRI at that time   Subjective:   I, Shirley Alvarez, am serving as a scribe for Dr. Glennon Mac  Chief Complaint: right leg pain and left sided low back pain  HPI:   09/17/21 Patient is a 40 year old female presenting with left sided low back pian and R leg pain. Patient locates pain to right knee pain that radiates down lateral calf. Patient has been having this pain for 3+ weeks and was seen by PCP on 09/08/21 and referred to sports medicine for evaluation and treatment. Today patient states the pain started in her R knee and then a week after that her L sided low back started to hurt. Patient states that she feels like her knee cap popped out and also experiencing swelling in the knee and the calf, also has a pulling sensation in her R calf. Sitting too long makes the pain worse. Back pain is described as a muscle ache/tight. Patient feels better when massaging the area and pain is worse  when laying on her back. Patient taking tylenol and voltaren gel for pain.   Relevant Historical Information: Hypertension, tobacco use, OSA  Additional pertinent review of systems negative.   Current Outpatient Medications:    acetaminophen (TYLENOL) 500 MG tablet, Take 1,000 mg by mouth 4 (four) times daily as needed for headache., Disp: , Rfl:    albuterol (VENTOLIN HFA) 108 (90 Base) MCG/ACT inhaler, Inhale into the lungs every 6 (six) hours as needed for wheezing or shortness of breath., Disp: , Rfl:    ibuprofen (ADVIL,MOTRIN) 100 MG/5ML suspension, Take 30 mLs (600 mg total) by mouth every 6 (six) hours as needed for mild pain or moderate pain (cramping)., Disp: 30 mL, Rfl: 1   meloxicam (MOBIC) 15 MG tablet, Take 1 tablet (15 mg total) by mouth daily., Disp: 30 tablet, Rfl: 0   NIFEdipine (PROCARDIA-XL/NIFEDICAL-XL) 30 MG 24 hr tablet, TAKE 1 TABLET(30 MG) BY MOUTH DAILY--, Disp: 90 tablet, Rfl: 1   Prenatal Vit w/Fe-Methylfol-FA (PNV PO), Take 1 tablet by mouth daily., Disp: , Rfl:    Objective:     Vitals:   09/17/21 0935  BP: (!) 140/100  Pulse: 94  SpO2: 98%  Weight: 237 lb (107.5 kg)  Height: 5\' 1"  (1.549 m)      Body mass index is 44.78 kg/m.    Physical Exam:    General:  awake, alert oriented, no acute distress nontoxic Skin: no suspicious lesions  or rashes Neuro:sensation intact, no deficits, strength 5/5 with no deficits, no atrophy, normal muscle tone Psych: No signs of anxiety, depression or other mood disorder  Right knee: Mild swelling No deformity Neg fluid wave, joint milking ROM Flex 100, Ext 5 NTTP over the quad tendon, medial fem condyle, lat fem condyle, patella, plica, patella tendon, tibial tuberostiy, fibular head, posterior fossa, pes anserine bursa, gerdy's tubercle, medial jt line, lateral jt line Neg lachman Neg sag sign Negative varus stress Negative valgus stress Positive McMurray Unable to perform Thessaly due to fear of  falling  Gait favoring left leg   Electronically signed by:  Benito Mccreedy D.[facility] 10:22 AM 09/17/21

## 2021-09-17 ENCOUNTER — Other Ambulatory Visit: Payer: Self-pay | Admitting: Internal Medicine

## 2021-09-17 ENCOUNTER — Ambulatory Visit (INDEPENDENT_AMBULATORY_CARE_PROVIDER_SITE_OTHER): Payer: 59

## 2021-09-17 ENCOUNTER — Ambulatory Visit: Payer: 59 | Admitting: Sports Medicine

## 2021-09-17 ENCOUNTER — Other Ambulatory Visit: Payer: Self-pay

## 2021-09-17 VITALS — BP 140/100 | HR 94 | Ht 61.0 in | Wt 237.0 lb

## 2021-09-17 DIAGNOSIS — M25561 Pain in right knee: Secondary | ICD-10-CM | POA: Diagnosis not present

## 2021-09-17 DIAGNOSIS — M545 Low back pain, unspecified: Secondary | ICD-10-CM

## 2021-09-17 MED ORDER — MELOXICAM 15 MG PO TABS: 15.0000 mg | ORAL_TABLET | Freq: Every day | ORAL | 0 refills | Status: DC

## 2021-09-17 NOTE — Patient Instructions (Addendum)
Good to see you  Meloxicam 15mg  daily for 2 weeks  Knee exercises given  Contact us if you would like to be referred to physical therapy Get Right knee x ray on the way out See me again in 4 weeks

## 2021-10-09 ENCOUNTER — Other Ambulatory Visit: Payer: Self-pay | Admitting: Obstetrics and Gynecology

## 2021-10-09 DIAGNOSIS — Z1231 Encounter for screening mammogram for malignant neoplasm of breast: Secondary | ICD-10-CM

## 2021-10-11 ENCOUNTER — Ambulatory Visit
Admission: RE | Admit: 2021-10-11 | Discharge: 2021-10-11 | Disposition: A | Discharge status: Home / Self Care | Payer: 59 | Source: Ambulatory Visit | Attending: Obstetrics and Gynecology | Admitting: Obstetrics and Gynecology

## 2021-10-11 ENCOUNTER — Other Ambulatory Visit: Payer: Self-pay

## 2021-10-11 DIAGNOSIS — Z1231 Encounter for screening mammogram for malignant neoplasm of breast: Secondary | ICD-10-CM

## 2021-10-14 NOTE — Progress Notes (Signed)
Benito Mccreedy D.[place] Phone: [phone]   Assessment and Plan:     1. Chronic pain of right knee -Chronic with exacerbation, subsequent visit - Upon further review, patient has had years of right knee pain that has been more consistent over the past 8 weeks.  Patient has failed >6 weeks of conservative therapy including home exercise program, NSAID course, activity modification with an unremarkable knee x-ray - Differential includes lateral meniscal tear versus patellofemoral syndrome - We will order MRI due to failure to improve with conservative therapy as well as patient's complaints of locking and giving out of her knee - Start physical therapy for knee and continue HEP - Start Tylenol 500 mg 2 tablets twice daily for regular pain control - Discontinue daily meloxicam.  May use remaining meloxicam as needed for breakthrough pain  2. Acute bilateral low back pain without sciatica -Acute, improved, subsequent visit - Largely improved low back pain - No further work-up at this time   Pertinent previous records reviewed include knee x-ray from 09/17/2021   Follow Up: Call back after completing MRI to set up follow-up appointment to discuss MRI and treatment plan   Subjective:   I, Judy Pimple, am serving as a scribe for Dr. Glennon Mac  Chief Complaint: Right leg and left sided low back pian follow up   HPI:   09/17/21 Patient is a 40 year old female presenting with left sided low back pian and R leg pain. Patient locates pain to right knee pain that radiates down lateral calf. Patient has been having this pain for 3+ weeks and was seen by PCP on 09/08/21 and referred to sports medicine for evaluation and treatment. Today patient states the pain started in her R knee and then a week after that her L sided low back started to hurt. Patient states that she feels like her knee cap popped out and  also experiencing swelling in the knee and the calf, also has a pulling sensation in her R calf. Sitting too long makes the pain worse. Back pain is described as a muscle ache/tight. Patient feels better when massaging the area and pain is worse when laying on her back. Patient taking tylenol and voltaren gel for pain.   10/15/21 Patient states back is progressively getting better. Right leg still in pain. Pain is till in the same area. Pain mostly in knee now radiating pain only happens occasionally. Pain located on lateral side. Prescription seems to make it better. States that she can feel the popping in  the knee more. No new complaints.  Relevant Historical Information:  Hypertension, tobacco use, OSA  Additional pertinent review of systems negative.   Current Outpatient Medications:    acetaminophen (TYLENOL) 500 MG tablet, Take 1,000 mg by mouth 4 (four) times daily as needed for headache., Disp: , Rfl:    albuterol (VENTOLIN HFA) 108 (90 Base) MCG/ACT inhaler, Inhale into the lungs every 6 (six) hours as needed for wheezing or shortness of breath., Disp: , Rfl:    ibuprofen (ADVIL,MOTRIN) 100 MG/5ML suspension, Take 30 mLs (600 mg total) by mouth every 6 (six) hours as needed for mild pain or moderate pain (cramping)., Disp: 30 mL, Rfl: 1   meloxicam (MOBIC) 15 MG tablet, Take 1 tablet (15 mg total) by mouth daily., Disp: 30 tablet, Rfl: 0   NIFEdipine (PROCARDIA-XL/NIFEDICAL-XL) 30 MG 24 hr tablet, TAKE 1 TABLET(30 MG) BY MOUTH DAILY, Disp:  90 tablet, Rfl: 1   Prenatal Vit w/Fe-Methylfol-FA (PNV PO), Take 1 tablet by mouth daily., Disp: , Rfl:    Objective:     Vitals:   10/15/21 0948  BP: 128/86  Weight: 235 lb (106.6 kg)  Height: 5\' 1"  (1.549 m)      Body mass index is 44.4 kg/m.    Physical Exam:    General:  awake, alert oriented, no acute distress nontoxic Skin: no suspicious lesions or rashes Neuro:sensation intact, no deficits, strength 5/5 with no deficits, no  atrophy, normal muscle tone Psych: No signs of anxiety, depression or other mood disorder  Right Knee: No swelling No deformity Neg fluid wave, joint milking ROM Flex 100, Ext 5 TTP lateral patella, lateral femoral condyle NTTP over the quad tendon, medial fem condyle, plica, patella tendon, tibial tuberostiy, fibular head, posterior fossa, pes anserine bursa, gerdy's tubercle, medial jt line, lateral jt line Neg lachman Neg sag sign Negative varus stress Negative valgus stress Negative McMurray + Thessaly  Gait normal    Electronically signed by:  Benito Mccreedy D.[facility] 10:07 AM 10/15/21

## 2021-10-15 ENCOUNTER — Ambulatory Visit (INDEPENDENT_AMBULATORY_CARE_PROVIDER_SITE_OTHER): Payer: 59 | Admitting: Sports Medicine

## 2021-10-15 ENCOUNTER — Other Ambulatory Visit: Payer: Self-pay

## 2021-10-15 VITALS — BP 128/86 | Ht 61.0 in | Wt 235.0 lb

## 2021-10-15 DIAGNOSIS — M25561 Pain in right knee: Secondary | ICD-10-CM

## 2021-10-15 DIAGNOSIS — M545 Low back pain, unspecified: Secondary | ICD-10-CM

## 2021-10-15 DIAGNOSIS — G8929 Other chronic pain: Secondary | ICD-10-CM

## 2021-10-15 NOTE — Patient Instructions (Signed)
Emm[facility] [phone] Call Today  When we receive your results we will contact you. PT referral put in Tylenol 500mg  2 tablets 2x a day Stop using meloxicam daily, but can continue to use as needed

## 2021-10-27 ENCOUNTER — Other Ambulatory Visit: Payer: Self-pay | Admitting: Sports Medicine

## 2021-11-01 ENCOUNTER — Ambulatory Visit
Admission: RE | Admit: 2021-11-01 | Discharge: 2021-11-01 | Disposition: A | Discharge status: Home / Self Care | Payer: 59 | Source: Ambulatory Visit | Attending: Sports Medicine | Admitting: Sports Medicine

## 2021-11-01 ENCOUNTER — Other Ambulatory Visit: Payer: Self-pay

## 2021-11-01 DIAGNOSIS — G8929 Other chronic pain: Secondary | ICD-10-CM

## 2021-11-06 ENCOUNTER — Telehealth: Payer: Self-pay | Admitting: Sports Medicine

## 2021-11-06 NOTE — Telephone Encounter (Signed)
Left VM for pt to call to schedule MRI f/u appt.

## 2021-11-06 NOTE — Telephone Encounter (Signed)
Pt called looking for Dr. Marisue [place] interpretation/plan for the knee MRI she recently had. Please call pt.

## 2021-11-10 ENCOUNTER — Ambulatory Visit: Payer: Self-pay

## 2021-11-10 ENCOUNTER — Other Ambulatory Visit: Payer: Self-pay

## 2021-11-10 ENCOUNTER — Ambulatory Visit: Payer: 59 | Admitting: Sports Medicine

## 2021-11-10 VITALS — BP 138/78 | HR 110 | Ht 61.0 in | Wt 235.0 lb

## 2021-11-10 DIAGNOSIS — M25561 Pain in right knee: Secondary | ICD-10-CM

## 2021-11-10 DIAGNOSIS — S83206D Unspecified tear of unspecified meniscus, current injury, right knee, subsequent encounter: Secondary | ICD-10-CM | POA: Diagnosis not present

## 2021-11-10 NOTE — Progress Notes (Signed)
Benito Mccreedy D.[place] Phone: [phone]   Assessment and Plan:     1. Tear of right meniscus as current injury, subsequent encounter 2. Right knee pain, unspecified chronicity -Chronic with exacerbation, subsequent visit - Worsening pain since previous office visit with MRI showing large joint effusion, meniscal tears - Patient elected for aspiration and injection.  Tolerated well per note below - Continue physical therapy - [place] LIMITED JOINT SPACE STRUCTURES LOW RIGHT(NO LINKED CHARGES)    Procedure: Ultrasound Guided Knee Joint Injection/Aspiration Side: Right Diagnosis: Right meniscal tear with effusion [place] Indication:  - accuracy is paramount for diagnosis - to ensure therapeutic efficacy or procedural success - to reduce procedural risk  Risks explained and consent was given verbally. The site was cleaned with Chlorhexidine. The suprapatellar pouch of the knee was identified.  A superficial wheal and numbing track was made using 25-gauge needle and 2 mL 1% lidocaine.  An 18-gauge needle was introduced to the pouch under ultrasound guidance.  40 mL of clear/straw colored synovial fluid was aspirated. Syringe was exchanged and injection given using 75mL of 1% lidocaine without epinephrine and 54mL of kenalog 40mg /ml. This was well tolerated and resulted in symptomatic relief.  Needle was removed, hemostasis achieved, and post injection instructions were explained.   Pt was advised to call or return to clinic if these symptoms worsen or fail to improve as anticipated.   Pertinent previous records reviewed include MRI 09/03/2021   Follow Up: 3 to 4 weeks for reevaluation.  If no improvement or worsening symptoms, could consider alternative injections including HA, PRP, or could refer to orthopedic surgery for potential arthroscopy   Subjective:   I, Moenique Parris, am serving as a Education administrator for Doctor  [employer]  Chief Complaint: right leg and left sided low back pain follow up   HPI:  09/17/21 Patient is a 40 year old female presenting with left sided low back pian and R leg pain. Patient locates pain to right knee pain that radiates down lateral calf. Patient has been having this pain for 3+ weeks and was seen by PCP on 09/08/21 and referred to sports medicine for evaluation and treatment. Today patient states the pain started in her R knee and then a week after that her L sided low back started to hurt. Patient states that she feels like her knee cap popped out and also experiencing swelling in the knee and the calf, also has a pulling sensation in her R calf. Sitting too long makes the pain worse. Back pain is described as a muscle ache/tight. Patient feels better when massaging the area and pain is worse when laying on her back. Patient taking tylenol and voltaren gel for pain.    10/15/21 Patient states back is progressively getting better. Right leg still in pain. Pain is till in the same area. Pain mostly in knee now radiating pain only happens occasionally. Pain located on lateral side. Prescription seems to make it better. States that she can feel the popping in  the knee more. No new complaints.   Relevant Historical Information:  Hypertension, tobacco use, OSA  11/10/21 Patient states that the pain is worse than her last visit but not as bad as the first visit.    Additional pertinent review of systems negative.   Current Outpatient Medications:    acetaminophen (TYLENOL) 500 MG tablet, Take 1,000 mg by mouth 4 (four) times daily as needed for  headache., Disp: , Rfl:    albuterol (VENTOLIN HFA) 108 (90 Base) MCG/ACT inhaler, Inhale into the lungs every 6 (six) hours as needed for wheezing or shortness of breath., Disp: , Rfl:    ibuprofen (ADVIL,MOTRIN) 100 MG/5ML suspension, Take 30 mLs (600 mg total) by mouth every 6 (six) hours as needed for mild pain or moderate pain  (cramping)., Disp: 30 mL, Rfl: 1   meloxicam (MOBIC) 15 MG tablet, Take 1 tablet (15 mg total) by mouth daily., Disp: 30 tablet, Rfl: 0   NIFEdipine (PROCARDIA-XL/NIFEDICAL-XL) 30 MG 24 hr tablet, TAKE 1 TABLET(30 MG) BY MOUTH DAILY, Disp: 90 tablet, Rfl: 1   Prenatal Vit w/Fe-Methylfol-FA (PNV PO), Take 1 tablet by mouth daily., Disp: , Rfl:    Objective:     Vitals:   11/10/21 1035  BP: 138/78  Pulse: (!) 110  SpO2: 98%  Weight: 235 lb (106.6 kg)  Height: 5\' 1"  (1.549 m)      Body mass index is 44.4 kg/m.    Physical Exam:    General:  awake, alert oriented, no acute distress nontoxic Skin: no suspicious lesions or rashes Neuro:sensation intact, no deficits, strength 5/5 with no deficits, no atrophy, normal muscle tone Psych: No signs of anxiety, depression or other mood disorder  Knee: Moderate swelling No deformity Positive luid wave, joint milking ROM Flex 90, Ext 10 TTP lateral patella, lateral femoral condyle NTTP over the quad tendon, medial fem condyle, patella, plica, patella tendon, tibial tuberostiy, fibular head, posterior fossa, pes anserine bursa, gerdy's tubercle, medial jt line,   Neg lachman Neg sag sign Negative varus stress Negative valgus stress Positive McMurray    Gait normal    Electronically signed by:  Benito Mccreedy D.[facility] 11:10 AM 11/10/21

## 2021-11-10 NOTE — Patient Instructions (Addendum)
Good to see you  Continue PT  3-4 week follow up  

## 2021-11-14 ENCOUNTER — Other Ambulatory Visit: Payer: Self-pay | Admitting: Internal Medicine

## 2021-12-05 NOTE — Progress Notes (Signed)
Benito Mccreedy D.[place] Phone: [phone]   Assessment and Plan:     1. Tear of right meniscus as current injury, subsequent encounter 2. Right knee pain, unspecified chronicity 3. Chronic pain of right knee -Chronic with improvement, subsequent visit - Moderate improvement in pain and no further episodes of instability, locking since aspiration and CSI injection at office visit on 11/10/2021 - Discussed risks and benefits of referral for arthroscopy with orthopedic surgery.  Discussed that small tear could become larger.  Discussed risks of repeat corticosteroid injections.  Patient elects to not proceed with orthopedic surgery referral to discuss arthroscopy as she has improved since previous visit - Continue activity as tolerated - Continue NSAIDs/Tylenol as needed for pain control   Pertinent previous records reviewed include none   Follow Up: As needed if recurrence of symptoms.  Could consider additional CSI at a future date or referral to orthopedic surgery to discuss arthroscopy for meniscal tear   Subjective:   I, Moenique Parris, am serving as a Education administrator for Doctor Glennon Mac  Chief Complaint: right leg and left sided low back pain   HPI:   09/17/21 Patient is a 41 year old female presenting with left sided low back pian and R leg pain. Patient locates pain to right knee pain that radiates down lateral calf. Patient has been having this pain for 3+ weeks and was seen by PCP on 09/08/21 and referred to sports medicine for evaluation and treatment. Today patient states the pain started in her R knee and then a week after that her L sided low back started to hurt. Patient states that she feels like her knee cap popped out and also experiencing swelling in the knee and the calf, also has a pulling sensation in her R calf. Sitting too long makes the pain worse. Back pain is described as a muscle  ache/tight. Patient feels better when massaging the area and pain is worse when laying on her back. Patient taking tylenol and voltaren gel for pain.    10/15/21 Patient states back is progressively getting better. Right leg still in pain. Pain is till in the same area. Pain mostly in knee now radiating pain only happens occasionally. Pain located on lateral side. Prescription seems to make it better. States that she can feel the popping in  the knee more. No new complaints.   12/08/2021 Patient states that she is good overall pain is not strong enough to be referred for surgery    Relevant Historical Information:  Hypertension, tobacco use, OSA   Additional pertinent review of systems negative.   Current Outpatient Medications:    acetaminophen (TYLENOL) 500 MG tablet, Take 1,000 mg by mouth 4 (four) times daily as needed for headache., Disp: , Rfl:    albuterol (VENTOLIN HFA) 108 (90 Base) MCG/ACT inhaler, Inhale into the lungs every 6 (six) hours as needed for wheezing or shortness of breath., Disp: , Rfl:    ibuprofen (ADVIL,MOTRIN) 100 MG/5ML suspension, Take 30 mLs (600 mg total) by mouth every 6 (six) hours as needed for mild pain or moderate pain (cramping)., Disp: 30 mL, Rfl: 1   meloxicam (MOBIC) 15 MG tablet, Take 1 tablet (15 mg total) by mouth daily., Disp: 30 tablet, Rfl: 0   NIFEdipine (PROCARDIA-XL/NIFEDICAL-XL) 30 MG 24 hr tablet, TAKE 1 TABLET(30 MG) BY MOUTH DAILY, Disp: 90 tablet, Rfl: 1   Prenatal Vit w/Fe-Methylfol-FA (PNV PO), Take 1 tablet  by mouth daily., Disp: , Rfl:    Objective:     Vitals:   12/08/21 1602  BP: 120/80  Pulse: 86  SpO2: 99%  Weight: 231 lb (104.8 kg)  Height: 5\' 1"  (1.549 m)      Body mass index is 43.65 kg/m.    Physical Exam:    General:  awake, alert oriented, no acute distress nontoxic Skin: no suspicious lesions or rashes Neuro:sensation intact, no deficits, strength 5/5 with no deficits, no atrophy, normal muscle tone Psych:  No signs of anxiety, depression or other mood disorder  Knee: No swelling No deformity Neg fluid wave, joint milking ROM Flex 100, Ext 5 TTP lateral femoral condyle, posterior fossa NTTP over the quad tendon, medial fem condyle,   patella, plica, patella tendon, tibial tuberostiy, fibular head,  pes anserine bursa, gerdy's tubercle, medial jt line, lateral jt line Neg lachman Neg sag sign Negative varus stress Negative valgus stress  Gait normal    Electronically signed by:  Benito Mccreedy D.[facility] 4:14 PM 12/08/21

## 2021-12-08 ENCOUNTER — Ambulatory Visit: Payer: 59 | Admitting: Sports Medicine

## 2021-12-08 ENCOUNTER — Other Ambulatory Visit: Payer: Self-pay

## 2021-12-08 VITALS — BP 120/80 | HR 86 | Ht 61.0 in | Wt 231.0 lb

## 2021-12-08 DIAGNOSIS — G8929 Other chronic pain: Secondary | ICD-10-CM | POA: Diagnosis not present

## 2021-12-08 DIAGNOSIS — S83206D Unspecified tear of unspecified meniscus, current injury, right knee, subsequent encounter: Secondary | ICD-10-CM | POA: Diagnosis not present

## 2021-12-08 DIAGNOSIS — M25561 Pain in right knee: Secondary | ICD-10-CM | POA: Diagnosis not present

## 2021-12-08 NOTE — Patient Instructions (Signed)
Good to see you  Follow up as needed  

## 2022-02-04 ENCOUNTER — Encounter: Payer: Self-pay | Admitting: Internal Medicine

## 2022-02-04 ENCOUNTER — Other Ambulatory Visit: Payer: Self-pay

## 2022-02-04 ENCOUNTER — Ambulatory Visit: Payer: 59 | Admitting: Internal Medicine

## 2022-02-04 VITALS — BP 122/76 | HR 85 | Temp 98.4°F | Ht 61.0 in | Wt 230.4 lb

## 2022-02-04 DIAGNOSIS — R42 Dizziness and giddiness: Secondary | ICD-10-CM | POA: Diagnosis not present

## 2022-02-04 DIAGNOSIS — J3489 Other specified disorders of nose and nasal sinuses: Secondary | ICD-10-CM

## 2022-02-04 DIAGNOSIS — Z720 Tobacco use: Secondary | ICD-10-CM

## 2022-02-04 DIAGNOSIS — R1110 Vomiting, unspecified: Secondary | ICD-10-CM | POA: Diagnosis not present

## 2022-02-04 DIAGNOSIS — H9192 Unspecified hearing loss, left ear: Secondary | ICD-10-CM

## 2022-02-04 MED ORDER — ALBUTEROL SULFATE HFA 108 (90 BASE) MCG/ACT IN AERS: 1.0000 | INHALATION_SPRAY | Freq: Four times a day (QID) | RESPIRATORY_TRACT | 2 refills | Status: DC | PRN

## 2022-02-04 MED ORDER — AMOXICILLIN 500 MG PO CAPS: 500.0000 mg | ORAL_CAPSULE | Freq: Three times a day (TID) | ORAL | 0 refills | Status: AC

## 2022-02-04 NOTE — Patient Instructions (Addendum)
Will do ent referral   as planned ?In interim can add antibiotic   afrin ns with caution if needed.  ? ?Allergy med if needed.  ? ?Try for  acid blocker   pepcid 2 x per day  for 2 weeks and if not  helping  issue ( possible reflux)  we can do a consult with GI specialist .  ?

## 2022-02-04 NOTE — Progress Notes (Signed)
? ?Chief Complaint  ?Patient presents with  ? Ear Pain  ?  Left ear mostly and sometimes the right and makes patient dizzy   ? ? ?HPI: ?ERYNN VACA 41 y.o. come in for   on going sx  ?Onset  3 weeks   like  a common cold  throat  and then ear  clogged.   ? ?All got better except sinus pressure  but left ear cant hear   right off and on no rx  .   No flare of allergy  And then  intermittent short lived vertigo with movement  neg covid test.  No  inc cough  ear  not painful but just  cant hear. Tried to pop .   And no success no sig HA and bp has been ok  asks for refill albuterol.  ? ?BTW asks advice  ( husband concern)  ?Also asking about 2-3 years of gi sx off and on   constipation at times  and post proandial vomiting sometimes without pain but maybe reflux   not related to certain foods  other .  No change when was pregnant( has 47 yo daughter)  ? ?Tobacco 4-5 per day  ?ROS: See pertinent positives and negatives per HPI. ? ?Past Medical History:  ?Diagnosis Date  ? Allergic rhinitis   ? AMA (advanced maternal age) primigravida 27+   ? Asthma   ? Fibroid   ? History of ectopic pregnancy   ? X3   last one 07/ 2015  (received MTX injection)  ? Hydrosalpinx   ? LEFT  ? Hypertension   ? Pelvic adhesions   ? Wears glasses   ? ? ?Family History  ?Problem Relation Age of Onset  ? Liver disease Father   ? Asthma Sister   ? Asthma Sister   ? Anesthesia problems Other   ? ? ?Social History  ? ?Socioeconomic History  ? Marital status: Married  ?  Spouse name: Not on file  ? Number of children: Not on file  ? Years of education: Not on file  ? Highest education level: Not on file  ?Occupational History  ? Not on file  ?Tobacco Use  ? Smoking status: Every Day  ?  Packs/day: 0.25  ?  Years: 19.00  ?  Pack years: 4.75  ?  Types: Cigarettes  ? Smokeless tobacco: Never  ?Vaping Use  ? Vaping Use: Never used  ?Substance and Sexual Activity  ? Alcohol use: No  ?  Alcohol/week: 0.0 standard drinks  ? Drug use: No  ? Sexual  activity: Yes  ?  Birth control/protection: None  ?Other Topics Concern  ? Not on file  ?Social History Narrative  ? Early Childhood education  ? Kindergarten age group  ?   ? History of miscarriage December 2011  ? ?Social Determinants of Health  ? ?Financial Resource Strain: Not on file  ?Food Insecurity: Not on file  ?Transportation Needs: Not on file  ?Physical Activity: Not on file  ?Stress: Not on file  ?Social Connections: Not on file  ? ? ?Outpatient Medications Prior to Visit  ?Medication Sig Dispense Refill  ? acetaminophen (TYLENOL) 500 MG tablet Take 1,000 mg by mouth 4 (four) times daily as needed for headache.    ? ibuprofen (ADVIL,MOTRIN) 100 MG/5ML suspension Take 30 mLs (600 mg total) by mouth every 6 (six) hours as needed for mild pain or moderate pain (cramping). 30 mL 1  ? meloxicam (MOBIC) 15 MG  tablet Take 1 tablet (15 mg total) by mouth daily. 30 tablet 0  ? NIFEdipine (PROCARDIA-XL/NIFEDICAL-XL) 30 MG 24 hr tablet TAKE 1 TABLET(30 MG) BY MOUTH DAILY 90 tablet 1  ? Prenatal Vit w/Fe-Methylfol-FA (PNV PO) Take 1 tablet by mouth daily.    ? albuterol (VENTOLIN HFA) 108 (90 Base) MCG/ACT inhaler Inhale into the lungs every 6 (six) hours as needed for wheezing or shortness of breath.    ? ?No facility-administered medications prior to visit.  ? ? ? ?EXAM: ? ?BP 122/76 (BP Location: Left Arm, Patient Position: Sitting, Cuff Size: Normal)   Pulse 85   Temp 98.4 ?F (36.9 ?C) (Oral)   Ht '5\' 1"'$  (1.549 m)   Wt 230 lb 6.4 oz (104.5 kg)   LMP 02/04/2022   SpO2 99%   BMI 43.53 kg/m?  ? ?Body mass index is 43.53 kg/m?. ? ?GENERAL: vitals reviewed and listed above, alert, oriented, appears well hydrated and in no acute distress ?HEENT: atraumatic, conjunctiva  clear, no obvious abnormalities on inspection of external nose and ears  tmx intact and nl reflex   hearing grossly dec on left   OP : no lesion edema or exudate  face  no edema nasal  inc turbinates  ?NECK: no obvious masses on inspection  palpation  ?LUNGS: clear to auscultation bilaterally, no wheezes, rales or rhonchi, good air movement ?CV: HRRR, no clubbing cyanosis or  peripheral edema nl cap refill  ?MS: moves all extremities without noticeable focal  abnormality ?PSYCH: pleasant and cooperative, no obvious depression or anxiety ?Neuro grossly nl  ?BP Readings from Last 3 Encounters:  ?02/04/22 122/76  ?12/08/21 120/80  ?11/10/21 138/78  ? ? ?ASSESSMENT AND PLAN: ? ?Discussed the following assessment and plan: ? ?Decreased hearing of left ear - with transient vertigo  afer uri. emprici rx with antibiotic for sinusitis but not convicned bacteria etiology . plkan referal to  ent.  - Plan: Ambulatory referral to ENT ? ?Sinus pressure ? ?Vertigo - since  had ur sx  - Plan: Ambulatory referral to ENT ? ?Postprandial vomiting - x 3 years  ? reflux  related  ? ?Tobacco user - 4-5 per day ?Poss eustachian tube dysfunction but not that congested today  .   ?Ent eval  ? ?Let us know if gi sx persist and can get  more evaluation  ? ?-Patient advised to return or notify health care team  if  new concerns arise. ? ?Patient Instructions  ?Will do ent referral   as planned ?In interim can add antibiotic   afrin ns with caution if needed.  ? ?Allergy med if needed.  ? ?Try for  acid blocker   pepcid 2 x per day  for 2 weeks and if not  helping  issue ( possible reflux)  we can do a consult with GI specialist .  ? ? ?Standley Brooking. Daren Doswell M.D. ?

## 2022-03-23 ENCOUNTER — Other Ambulatory Visit: Payer: Self-pay | Admitting: Internal Medicine

## 2022-03-25 ENCOUNTER — Ambulatory Visit: Payer: 59 | Admitting: Family Medicine

## 2022-03-25 ENCOUNTER — Encounter: Payer: Self-pay | Admitting: Family Medicine

## 2022-03-25 ENCOUNTER — Telehealth: Payer: Self-pay | Admitting: Internal Medicine

## 2022-03-25 VITALS — BP 112/80 | HR 64 | Temp 98.5°F | Wt 231.0 lb

## 2022-03-25 DIAGNOSIS — R6883 Chills (without fever): Secondary | ICD-10-CM

## 2022-03-25 DIAGNOSIS — R051 Acute cough: Secondary | ICD-10-CM | POA: Diagnosis not present

## 2022-03-25 DIAGNOSIS — J029 Acute pharyngitis, unspecified: Secondary | ICD-10-CM | POA: Diagnosis not present

## 2022-03-25 DIAGNOSIS — J020 Streptococcal pharyngitis: Secondary | ICD-10-CM

## 2022-03-25 LAB — POCT INFLUENZA A/B
Influenza A, POC: NEGATIVE
Influenza B, POC: NEGATIVE

## 2022-03-25 LAB — POCT RAPID STREP A (OFFICE): Rapid Strep A Screen: POSITIVE — AB

## 2022-03-25 LAB — POC COVID19 BINAXNOW: SARS Coronavirus 2 Ag: NEGATIVE

## 2022-03-25 MED ORDER — AMOXICILLIN-POT CLAVULANATE 875-125 MG PO TABS: 1.0000 | ORAL_TABLET | Freq: Two times a day (BID) | ORAL | 0 refills | Status: DC

## 2022-03-25 NOTE — Progress Notes (Signed)
? ?  Subjective:  ? ? Patient ID: Shirley Alvarez, female    DOB: 03/16/1981, 41 y.o.   MRN: [id number] ? ?HPI ?Here for 3 days of a ST, PND, and left ear pain. No fever or body aches. No cough. She is taking Tylenol. ? ? ?Review of Systems  ?Constitutional: Negative.   ?HENT:  Positive for congestion, ear pain, postnasal drip and sore throat. Negative for trouble swallowing.   ?Eyes: Negative.   ?Respiratory: Negative.    ? ?   ?Objective:  ? Physical Exam ?Constitutional:   ?   Appearance: Normal appearance. She is not ill-appearing.  ?HENT:  ?   Right Ear: Tympanic membrane, ear canal and external ear normal.  ?   Left Ear: Tympanic membrane, ear canal and external ear normal.  ?   Nose: Nose normal.  ?   Mouth/Throat:  ?   Pharynx: Posterior oropharyngeal erythema present. No oropharyngeal exudate.  ?Eyes:  ?   Conjunctiva/sclera: Conjunctivae normal.  ?Pulmonary:  ?   Effort: Pulmonary effort is normal.  ?   Breath sounds: Normal breath sounds.  ?Lymphadenopathy:  ?   Cervical: No cervical adenopathy.  ?Neurological:  ?   Mental Status: She is alert.  ? ? ? ? ? ?   ?Assessment & Plan:  ?Strep pharyngitis, treat with Augmentin for 10 days. ?Alysia Penna, MD ? ? ?

## 2022-03-25 NOTE — Telephone Encounter (Signed)
Patient calling in with respiratory symptoms: ?Shortness of breath, chest pain, palpitations or other red words send to Triage ? ?Does the patient have a fever over 100, cough, congestion, sore throat, runny nose, lost of taste/smell (please list symptoms that patient has)?cough,st ? ?What date did symptoms start?03-23-2022 ?(If over 5 days ago, pt may be scheduled for in person visit) ? ?Have you tested for Covid in the last 5 days? Yes  ? ?If yes, was it positive '[]'$  OR negative '[x]'$ ? If positive in the last 5 days, please schedule virtual visit now. If negative, schedule for an in person OV with the next available provider if PCP has no openings. Please also let patient know they will be tested again (follow the script below) ? ?"you will have to arrive 68mns prior to your appt time to be Covid tested. Please park in back of office at the [facility] & call [phone] let the staff know you have arrived. A staff member will meet you at your car to do a rapid covid test. Once the test has resulted you will be notified by phone of your results to determine if appt will remain an in person visit or be converted to a virtual/phone visit. If you arrive less than 328ms before your appt time, your visit will be automatically converted to virtual & any recommended testing will happen AFTER the visit." ? ?Pt has an appt with dr fry 03-25-2022 945 am ?THINGS TO REMEMBER ? ?If no availability for virtual visit in office,  please schedule another [place] office ? ?If no availability at another [facility], please instruct patient that they can schedule an evisit or virtual visit through their mychart account. Visits up to 8pm ? ?patients can be seen in office 5 days after positive COVID test ? ?  ?

## 2023-11-25 ENCOUNTER — Ambulatory Visit: Payer: BC Managed Care – PPO | Admitting: Internal Medicine

## 2023-11-25 VITALS — BP 160/118 | HR 97 | Temp 98.3°F | Ht 60.75 in | Wt 226.8 lb

## 2023-11-25 DIAGNOSIS — Z79899 Other long term (current) drug therapy: Secondary | ICD-10-CM | POA: Diagnosis not present

## 2023-11-25 DIAGNOSIS — Z6841 Body Mass Index (BMI) 40.0 and over, adult: Secondary | ICD-10-CM

## 2023-11-25 DIAGNOSIS — Z0000 Encounter for general adult medical examination without abnormal findings: Secondary | ICD-10-CM | POA: Diagnosis not present

## 2023-11-25 DIAGNOSIS — E66813 Obesity, class 3: Secondary | ICD-10-CM

## 2023-11-25 DIAGNOSIS — R1110 Vomiting, unspecified: Secondary | ICD-10-CM

## 2023-11-25 DIAGNOSIS — Z720 Tobacco use: Secondary | ICD-10-CM | POA: Diagnosis not present

## 2023-11-25 DIAGNOSIS — Z1159 Encounter for screening for other viral diseases: Secondary | ICD-10-CM

## 2023-11-25 DIAGNOSIS — I10 Essential (primary) hypertension: Secondary | ICD-10-CM | POA: Diagnosis not present

## 2023-11-25 LAB — CBC WITH DIFFERENTIAL/PLATELET
Basophils Absolute: 0.1 10*3/uL (ref 0.0–0.1)
Basophils Relative: 0.7 % (ref 0.0–3.0)
Eosinophils Absolute: 0.2 10*3/uL (ref 0.0–0.7)
Eosinophils Relative: 2.9 % (ref 0.0–5.0)
HCT: 38.9 % (ref 36.0–46.0)
Hemoglobin: 12.3 g/dL (ref 12.0–15.0)
Lymphocytes Relative: 29 % (ref 12.0–46.0)
Lymphs Abs: 2.4 10*3/uL (ref 0.7–4.0)
MCHC: 31.7 g/dL (ref 30.0–36.0)
MCV: 77.4 fL — ABNORMAL LOW (ref 78.0–100.0)
Monocytes Absolute: 0.6 10*3/uL (ref 0.1–1.0)
Monocytes Relative: 6.8 % (ref 3.0–12.0)
Neutro Abs: 5.1 10*3/uL (ref 1.4–7.7)
Neutrophils Relative %: 60.6 % (ref 43.0–77.0)
Platelets: 461 10*3/uL — ABNORMAL HIGH (ref 150.0–400.0)
RBC: 5.02 Mil/uL (ref 3.87–5.11)
RDW: 17.6 % — ABNORMAL HIGH (ref 11.5–15.5)
WBC: 8.4 10*3/uL (ref 4.0–10.5)

## 2023-11-25 LAB — HEMOGLOBIN A1C: Hgb A1c MFr Bld: 5.7 % (ref 4.6–6.5)

## 2023-11-25 LAB — LIPID PANEL
Cholesterol: 183 mg/dL (ref 0–200)
HDL: 51.2 mg/dL (ref >39.00)
LDL Cholesterol: 121 mg/dL — ABNORMAL HIGH (ref 0–99)
NonHDL: 131.79
Total CHOL/HDL Ratio: 4
Triglycerides: 53 mg/dL (ref 0.0–149.0)
VLDL: 10.6 mg/dL (ref 0.0–40.0)

## 2023-11-25 LAB — COMPREHENSIVE METABOLIC PANEL
ALT: 18 U/L (ref 0–35)
AST: 17 U/L (ref 0–37)
Albumin: 4.2 g/dL (ref 3.5–5.2)
Alkaline Phosphatase: 83 U/L (ref 39–117)
BUN: 9 mg/dL (ref 6–23)
CO2: 26 meq/L (ref 19–32)
Calcium: 10.2 mg/dL (ref 8.4–10.5)
Chloride: 103 meq/L (ref 96–112)
Creatinine, Ser: 0.79 mg/dL (ref 0.40–1.20)
GFR: 92.4 mL/min (ref >60.00)
Glucose, Bld: 89 mg/dL (ref 70–99)
Potassium: 4.1 meq/L (ref 3.5–5.1)
Sodium: 137 meq/L (ref 135–145)
Total Bilirubin: 0.3 mg/dL (ref 0.2–1.2)
Total Protein: 7.5 g/dL (ref 6.0–8.3)

## 2023-11-25 LAB — TSH: TSH: 2.63 u[IU]/mL (ref 0.35–5.50)

## 2023-11-25 MED ORDER — ALBUTEROL SULFATE HFA 108 (90 BASE) MCG/ACT IN AERS: 1.0000 | INHALATION_SPRAY | Freq: Four times a day (QID) | RESPIRATORY_TRACT | 2 refills | Status: AC | PRN

## 2023-11-25 NOTE — Patient Instructions (Signed)
 Good to see you today  BP is too high . Restart the nifedipine  medication daily  If crushing not sure get same effect.  Check Bp readings at home twice a day for 3-4 days in a row or more and send readings.   Yes stop tobacco . As in past.   Take pepcid twice a day for 3-4 weeks  every day   Plan fu

## 2023-11-25 NOTE — Progress Notes (Signed)
 Chief Complaint  Patient presents with   Annual Exam    Pt reports she did not fast. Has not taken Nifedipine  for a couple of months. Request a refill on inhaler.     HPI: Patient  Shirley Alvarez  42 y.o. comes in today for Preventive Health Care visit  and fu of meds and ht . She is contemplating pregnancy again  ( child is now 5 year)  BP  not been tracking   .Was doing ok  without medication  nifedipine  xl 30 off for at least 2 months  has wrist monitor  from past  Periods  reg  every 22 days  Back to tobacco limited  but had stopped totally with pregnancy.  Ocass wheezing  feels seasonal. Used inhaler.  Daily  when flaores  for 1-2 weeks.  Describe post cough emesis that she feels I reflux for a while . Thinks reflux causes the cough . Pepcid prn but not on a regular basis  No reg snaids Doesn't  swallow pills  crushes them up Prev dx osa not requiring intervnetion Health Maintenance  Topic Date Due   Hepatitis C Screening  Never done   COVID-19 Vaccine (3 - Pfizer risk series) 12/11/2023 (Originally 03/06/2020)   INFLUENZA VACCINE  02/21/2024 (Originally 06/24/2023)   Cervical Cancer Screening (HPV/Pap Cotest)  04/09/2024   DTaP/Tdap/Td (2 - Tdap) 05/28/2025   HIV Screening  Completed   HPV VACCINES  Aged Out   Health Maintenance Review LIFESTYLE:  Exercise:  not enough work.  Tobacco/ETS: les 1/2 ppd    Alcohol:  once a month  Sugar beverages:sweet tea  once a day  soda  once a day .  Sleep:  about  varies 4-8   no intervention with sleep ap Drug use: no HH of  3  no pets Work: Y 40 hours in person   ROS:  GEN/ HEENT: No fever, significant weight changes sweats headaches vision problems hearing changes, CV/ PULM; No chest pain shortness of breath cough, syncope,edema  change in exercise tolerance.x as per hpi  GI /GU: No adominal pain, vomiting, change in bowel habits. No blood in the stool. No significant GU symptoms. SKIN/HEME: ,no acute skin rashes  suspicious lesions or bleeding. No lymphadenopathy, nodules, masses.  NEURO/ PSYCH:  No neurologic signs such as weakness numbness. No depression anxiety. Ocass UE tingling has hx of? Ct sx  no weakness IMM/ Allergy: No unusual infections.  Allergy .   REST of 12 system review negative except as per HPI   Past Medical History:  Diagnosis Date   Allergic rhinitis    AMA (advanced maternal age) primigravida 35+    Asthma    Fibroid    History of ectopic pregnancy    X3   last one 07/ 2015  (received MTX injection)   Hydrosalpinx    LEFT   Hypertension    Pelvic adhesions    Wears glasses     Past Surgical History:  Procedure Laterality Date   CESAREAN SECTION N/A 02/14/2019   Procedure: CESAREAN SECTION;  Surgeon: Timmie Norris, MD;  Location: [facility];  Service: Obstetrics;  Laterality: N/A;   CHROMOPERTUBATION Bilateral 11/07/2014   Procedure: CHROMOPERTUBATION;  Surgeon: Cynthia Loss, MD;  Location: [facility];  Service: Gynecology;  Laterality: Bilateral;   LAPAROSCOPIC LYSIS OF ADHESIONS N/A 11/07/2014   Procedure: LAPAROSCOPIC LYSIS OF ADHESIONS AND FIMBRIOPLASTY;  Surgeon: Cynthia Loss, MD;  Location: [facility];  Service:  Gynecology;  Laterality: N/A;   WISDOM TOOTH EXTRACTION      Family History  Problem Relation Age of Onset   Liver disease Father    Asthma Sister    Asthma Sister    Anesthesia problems Other     Social History   Socioeconomic History   Marital status: Married    Spouse name: Not on file   Number of children: Not on file   Years of education: Not on file   Highest education level: Master's degree (e.g., MA, MS, MEng, MEd, MSW, MBA)  Occupational History   Not on file  Tobacco Use   Smoking status: Every Day    Current packs/day: 0.25    Average packs/day: 0.3 packs/day for 19.0 years (4.8 ttl pk-yrs)    Types: Cigarettes   Smokeless tobacco: Never  Vaping Use   Vaping status: Never Used   Substance and Sexual Activity   Alcohol use: No    Alcohol/week: 0.0 standard drinks of alcohol   Drug use: No   Sexual activity: Yes    Birth control/protection: None  Other Topics Concern   Not on file  Social History Narrative   Early Childhood education   Kindergarten age group      History of miscarriage December 2011   Social Drivers of Corporate Investment Banker Strain: Low Risk  (11/25/2023)   Overall Financial Resource Strain (CARDIA)    Difficulty of Paying Living Expenses: Not very hard  Food Insecurity: No Food Insecurity (11/25/2023)   Hunger Vital Sign    Worried About Running Out of Food in the Last Year: Never true    Ran Out of Food in the Last Year: Never true  Transportation Needs: No Transportation Needs (11/25/2023)   PRAPARE - Administrator, Civil Service (Medical): No    Lack of Transportation (Non-Medical): No  Physical Activity: Insufficiently Active (11/25/2023)   Exercise Vital Sign    Days of Exercise per Week: 3 days    Minutes of Exercise per Session: 30 min  Stress: No Stress Concern Present (11/25/2023)   [employer] of Occupational Health - Occupational Stress Questionnaire    Feeling of Stress : Not at all  Social Connections: Moderately Integrated (11/25/2023)   Social Connection and Isolation Panel [NHANES]    Frequency of Communication with Friends and Family: Once a week    Frequency of Social Gatherings with Friends and Family: Once a week    Attends Religious Services: More than 4 times per year    Active Member of [employer] or Organizations: Yes    Attends Engineer, Structural: More than 4 times per year    Marital Status: Married    Outpatient Medications Prior to Visit  Medication Sig Dispense Refill   acetaminophen  (TYLENOL ) 500 MG tablet Take 1,000 mg by mouth 4 (four) times daily as needed for headache.     ibuprofen  (ADVIL ,MOTRIN ) 100 MG/5ML suspension Take 30 mLs (600 mg total) by mouth every 6 (six) hours as  needed for mild pain or moderate pain (cramping). 30 mL 1   albuterol  (VENTOLIN  HFA) 108 (90 Base) MCG/ACT inhaler Inhale 1-2 puffs into the lungs every 6 (six) hours as needed for wheezing or shortness of breath. 1 each 2   meloxicam  (MOBIC ) 15 MG tablet Take 1 tablet (15 mg total) by mouth daily. (Patient not taking: Reported on 11/25/2023) 30 tablet 0   NIFEdipine  (PROCARDIA -XL/NIFEDICAL-XL) 30 MG 24 hr tablet TAKE 1 TABLET(30 MG) BY  MOUTH DAILY (Patient not taking: Reported on 11/25/2023) 90 tablet 1   Prenatal Vit w/Fe-Methylfol-FA (PNV PO) Take 1 tablet by mouth daily. (Patient not taking: Reported on 11/25/2023)     amoxicillin -clavulanate (AUGMENTIN ) 875-125 MG tablet Take 1 tablet by mouth 2 (two) times daily. 20 tablet 0   No facility-administered medications prior to visit.     EXAM:  BP (!) 160/118 (BP Location: Right Arm, Patient Position: Sitting, Cuff Size: Large)   Pulse 97   Temp 98.3 F (36.8 C) (Oral)   Ht 5' 0.75 (1.543 m)   Wt 226 lb 12.8 oz (102.9 kg)   LMP 11/20/2023 (Exact Date)   SpO2 99%   Breastfeeding No   BMI 43.21 kg/m   Body mass index is 43.21 kg/m. Wt Readings from Last 3 Encounters:  11/25/23 226 lb 12.8 oz (102.9 kg)  03/25/22 231 lb (104.8 kg)  02/04/22 230 lb 6.4 oz (104.5 kg)    Physical Exam: Vital signs reviewed HZW:Uypd is a well-developed well-nourished alert cooperative    who appearsr stated age in no acute distress.  HEENT: normocephalic atraumatic , Eyes: PERRL EOM's full, conjunctiva clear, Nares: paten,t no deformity discharge or tenderness., Ears: no deformity EAC's clear TMs with normal landmarks. Mouth: clear OP, no lesions, edema.  Moist mucous membranes. Dentition in adequate repair. NECK: supple without masses, thyromegaly or bruits. CHEST/PULM:  Clear to auscultation and percussion breath sounds equal no wheeze , rales or rhonchi. No chest wall deformities or tenderness. Breast: normal by inspection . No dimpling, discharge,  masses, tenderness or discharge . CV: PMI is nondisplaced, S1 S2 no gallops, murmurs, rubs. Peripheral pulses are full without delay.No JVD .  ABDOMEN: Bowel sounds normal nontender  No guard or rebound, no hepato splenomegal no CVA tenderness.  No hernia. Extremtities:  No clubbing cyanosis or edema, no acute joint swelling or redness no focal atrophy NEURO:  Oriented x3, cranial nerves 3-12 appear to be intact, no obvious focal weakness,gait within normal limits no abnormal reflexes or asymmetrical SKIN: No acute rashes normal turgor, color, no bruising or petechiae. PSYCH: Oriented, good eye contact, no obvious depression anxiety, cognition and judgment appear normal. LN: no cervical axillary inguinal adenopathy  Lab Results  Component Value Date   WBC 10.6 (H) 09/08/2021   HGB 12.7 09/08/2021   HCT 40.0 09/08/2021   PLT 457.0 (H) 09/08/2021   GLUCOSE 90 09/08/2021   CHOL 152 01/01/2021   TRIG 56.0 01/01/2021   HDL 43.90 01/01/2021   LDLCALC 97 01/01/2021   ALT 18 09/08/2021   AST 18 09/08/2021   NA 133 (L) 09/08/2021   K 4.3 09/08/2021   CL 103 09/08/2021   CREATININE 0.92 09/08/2021   BUN 12 09/08/2021   CO2 24 09/08/2021   TSH 2.75 01/01/2021   INR 1.00 07/08/2017   HGBA1C 5.6 01/01/2021    BP Readings from Last 3 Encounters:  11/25/23 (!) 160/118  03/25/22 112/80  02/04/22 122/76    Lab rplanreviewed with patient  is fasting today   ASSESSMENT AND PLAN:  Discussed the following assessment and plan:    ICD-10-CM   1. Well adult exam  Z00.00 CBC with Differential/Platelet    Comprehensive metabolic panel    Lipid panel    TSH    Hemoglobin A1c    Hep C Antibody    2. Essential hypertension  I10 CBC with Differential/Platelet    Comprehensive metabolic panel    Lipid panel    TSH  Hemoglobin A1c   off med not controlled inpast aldomet  and labetelol was not a controller or had se    3. Medication management  Z79.899 CBC with Differential/Platelet     Comprehensive metabolic panel    Lipid panel    TSH    Hemoglobin A1c    4. Postprandial vomiting prob reflux  R11.10 CBC with Differential/Platelet    Comprehensive metabolic panel    Lipid panel    TSH    Hemoglobin A1c   describes causeing cough vomiting but no dysphagia    5. Tobacco user  Z72.0 CBC with Differential/Platelet    Comprehensive metabolic panel    Lipid panel    TSH    Hemoglobin A1c    6. Need for hepatitis C screening test  Z11.59 Hep C Antibody    7. Class 3 severe obesity with serious comorbidity in adult, unspecified BMI, unspecified obesity type (HCC)  Z33.186    E66.01     Get back on nifedipine  since contemplating preg  ( concern that she if really crushing an xl med) may need a different plan. But apparently worked well before Still advise dc tobacco.   Suspect gi sx area all from reflux   and no other alarm sx ( predated last preg)  Antireflux measures  Take pepcid 20 mg bid until  fu  ( a month)   Send in readings fro my chart   And fu appt 1 mos  virutal or in person to fy bp and r eflux sx  Lab pending today  Return in about 1 month (around 12/26/2023) for for bp and reflux symptoms  virtual visit or in person.   is ok .  Patient Care Team: Twylah Bennetts K, MD as PCP - General Storm Setter, DO as Consulting Physician (Obstetrics and Gynecology) Patient Instructions  Good to see you today  BP is too high . Restart the nifedipine  medication daily  If crushing not sure get same effect.  Check Bp readings at home twice a day for 3-4 days in a row or more and send readings.   Yes stop tobacco . As in past.   Take pepcid twice a day for 3-4 weeks  every day   Plan fu     Jedrick Hutcherson K. Renatha Rosen M.D.

## 2023-11-26 LAB — HEPATITIS C ANTIBODY: Hepatitis C Ab: NONREACTIVE

## 2023-11-28 NOTE — Progress Notes (Signed)
 Cholesterol up some from 2 years ago . Should address with healthy lifestyle eating and activity. NO anemia  elevated platelet count can be seen in iron deficiency and  inflammation  . Most likely mild iron deficiency  make sure taking   prenatal vits that should contain iron . Can review any questions at your fu appt next month

## 2023-12-28 ENCOUNTER — Ambulatory Visit: Payer: BC Managed Care – PPO | Admitting: Internal Medicine

## 2023-12-28 NOTE — Progress Notes (Signed)
 Chief Complaint  Patient presents with   Medical Management of Chronic Issues    Follow up on BP. Still high this am BP 158/109. Still not taking med,nifedipine ,  as should due to med give her headache and urinate frequency. Was taking it at night. Pt states she her BP med this morning.    Follow-up    Pt states her reflux is fine. Taking pepcid before meal as needed.    HPI: Shirley Alvarez 43 y.o. come in for Chronic disease management   FU bp management  pre conception   Fu BP and gerd sx  bp helps but gets se  and tryinf off an on .  Leg cfamps at times  Gi sx better  Continues tobacco ? Pepcid is helping   and using as needed,. ROS: See pertinent positives and negatives per HPI.  Past Medical History:  Diagnosis Date   Allergic rhinitis    AMA (advanced maternal age) primigravida 35+    Asthma    Fibroid    History of ectopic pregnancy    X3   last one 07/ 2015  (received MTX injection)   Hydrosalpinx    LEFT   Hypertension    Pelvic adhesions    Wears glasses     Family History  Problem Relation Age of Onset   Liver disease Father    Asthma Sister    Asthma Sister    Anesthesia problems Other     Social History   Socioeconomic History   Marital status: Married    Spouse name: Not on file   Number of children: Not on file   Years of education: Not on file   Highest education level: Master's degree (e.g., MA, MS, MEng, MEd, MSW, MBA)  Occupational History   Not on file  Tobacco Use   Smoking status: Every Day    Current packs/day: 0.25    Average packs/day: 0.3 packs/day for 19.0 years (4.8 ttl pk-yrs)    Types: Cigarettes   Smokeless tobacco: Never  Vaping Use   Vaping status: Never Used  Substance and Sexual Activity   Alcohol use: No    Alcohol/week: 0.0 standard drinks of alcohol   Drug use: No   Sexual activity: Yes    Birth control/protection: None  Other Topics Concern   Not on file  Social History Narrative   Early Childhood  education   Kindergarten age group      History of miscarriage December 2011   Social Drivers of Corporate Investment Banker Strain: Low Risk  (11/25/2023)   Overall Financial Resource Strain (CARDIA)    Difficulty of Paying Living Expenses: Not very hard  Food Insecurity: No Food Insecurity (11/25/2023)   Hunger Vital Sign    Worried About Running Out of Food in the Last Year: Never true    Ran Out of Food in the Last Year: Never true  Transportation Needs: No Transportation Needs (11/25/2023)   PRAPARE - Administrator, Civil Service (Medical): No    Lack of Transportation (Non-Medical): No  Physical Activity: Insufficiently Active (11/25/2023)   Exercise Vital Sign    Days of Exercise per Week: 3 days    Minutes of Exercise per Session: 30 min  Stress: No Stress Concern Present (11/25/2023)   [employer] of Occupational Health - Occupational Stress Questionnaire    Feeling of Stress : Not at all  Social Connections: Moderately Integrated (11/25/2023)   Social Connection and Isolation Panel [  NHANES]    Frequency of Communication with Friends and Family: Once a week    Frequency of Social Gatherings with Friends and Family: Once a week    Attends Religious Services: More than 4 times per year    Active Member of [employer] or Organizations: Yes    Attends Engineer, Structural: More than 4 times per year    Marital Status: Married    Outpatient Medications Prior to Visit  Medication Sig Dispense Refill   acetaminophen  (TYLENOL ) 500 MG tablet Take 1,000 mg by mouth 4 (four) times daily as needed for headache.     albuterol  (VENTOLIN  HFA) 108 (90 Base) MCG/ACT inhaler Inhale 1-2 puffs into the lungs every 6 (six) hours as needed for wheezing or shortness of breath. 1 each 2   IBUPROFEN  PO Take by mouth.     ibuprofen  (ADVIL ,MOTRIN ) 100 MG/5ML suspension Take 30 mLs (600 mg total) by mouth every 6 (six) hours as needed for mild pain or moderate pain (cramping). 30 mL 1    NIFEdipine  (PROCARDIA -XL/NIFEDICAL-XL) 30 MG 24 hr tablet TAKE 1 TABLET(30 MG) BY MOUTH DAILY 90 tablet 1   meloxicam  (MOBIC ) 15 MG tablet Take 1 tablet (15 mg total) by mouth daily. (Patient not taking: Reported on 12/29/2023) 30 tablet 0   Prenatal Vit w/Fe-Methylfol-FA (PNV PO) Take 1 tablet by mouth daily. (Patient not taking: Reported on 12/29/2023)     No facility-administered medications prior to visit.     EXAM:  BP 136/78 (BP Location: Left Arm, Patient Position: Sitting, Cuff Size: Large)   Pulse 99   Temp 98.3 F (36.8 C) (Oral)   Ht 5' 0.08 (1.526 m)   Wt 226 lb 9.6 oz (102.8 kg)   LMP 12/13/2023 (Exact Date)   SpO2 97%   BMI 44.14 kg/m   Body mass index is 44.14 kg/m. BP Readings from Last 3 Encounters:  12/29/23 136/78  11/25/23 (!) 160/118  03/25/22 112/80    GENERAL: vitals reviewed and listed above, alert, oriented, appears well hydrated and in no acute distress HEENT: atraumatic, conjunctiva  clear, no obvious abnormalities on inspection of external nose and ears OP : no lesion edema or exudate  NECK: no obvious masses on inspection palpation  LUNGS: clear to auscultation bilaterally, no wheezes, rales or rhonchi, good air movement CV: HRRR, no clubbing cyanosis or  peripheral edema nl cap refill  MS: moves all extremities without noticeable focal  abnormality PSYCH: pleasant and cooperative, no obvious depression or anxiety Lab Results  Component Value Date   WBC 8.4 11/25/2023   HGB 12.3 11/25/2023   HCT 38.9 11/25/2023   PLT 461.0 (H) 11/25/2023   GLUCOSE 89 11/25/2023   CHOL 183 11/25/2023   TRIG 53.0 11/25/2023   HDL 51.20 11/25/2023   LDLCALC 121 (H) 11/25/2023   ALT 18 11/25/2023   AST 17 11/25/2023   NA 137 11/25/2023   K 4.1 11/25/2023   CL 103 11/25/2023   CREATININE 0.79 11/25/2023   BUN 9 11/25/2023   CO2 26 11/25/2023   TSH 2.63 11/25/2023   INR 1.00 07/08/2017   HGBA1C 5.7 11/25/2023   BP Readings from Last 3 Encounters:   12/29/23 136/78  11/25/23 (!) 160/118  03/25/22 112/80    ASSESSMENT AND PLAN:  Discussed the following assessment and plan:  Essential hypertension - much better ( has heleped in past.  Postprandial vomiting prob reflux - resolved controlled  with pep cid and then prn doing much better  Medication  side effect Bp controlled with medication but  prob  se of med temporary HA    as disc  may resolve with time  if takes reg  and follow bp ok to take at night ( reports tat happened before  but tolerated in last pregnancy)  Not sure leg cramps related  but   decrease sodium in diet  that may help.  Leg cramps  and any fluid retention Fu 6 mos earlier if an problems alarming or  persistent or progressive  Get on pre natals   know to stop tobacco  -Patient advised to return or notify health care team  if  new concerns arise. In interim   Patient Instructions  Good to see you today  Bp much better   stay on med as possible.   Limit sodium  in foods .   Alyxandria Wentz K. Selig Wampole M.D.

## 2023-12-29 ENCOUNTER — Encounter: Payer: Self-pay | Admitting: Internal Medicine

## 2023-12-29 ENCOUNTER — Ambulatory Visit (INDEPENDENT_AMBULATORY_CARE_PROVIDER_SITE_OTHER): Payer: BC Managed Care – PPO | Admitting: Internal Medicine

## 2023-12-29 VITALS — BP 136/78 | HR 99 | Temp 98.3°F | Ht 60.08 in | Wt 226.6 lb

## 2023-12-29 DIAGNOSIS — T887XXA Unspecified adverse effect of drug or medicament, initial encounter: Secondary | ICD-10-CM

## 2023-12-29 DIAGNOSIS — I10 Essential (primary) hypertension: Secondary | ICD-10-CM

## 2023-12-29 DIAGNOSIS — R1110 Vomiting, unspecified: Secondary | ICD-10-CM | POA: Diagnosis not present

## 2023-12-29 MED ORDER — NIFEDIPINE ER OSMOTIC RELEASE 30 MG PO TB24: ORAL_TABLET | ORAL | 1 refills | Status: DC

## 2023-12-29 NOTE — Patient Instructions (Addendum)
 Good to see you today  Bp much better   stay on med as possible.   Limit sodium  in foods .

## 2024-06-26 ENCOUNTER — Other Ambulatory Visit: Payer: Self-pay | Admitting: Internal Medicine

## 2024-06-28 ENCOUNTER — Ambulatory Visit: Payer: BC Managed Care – PPO | Admitting: Internal Medicine

## 2024-08-31 ENCOUNTER — Ambulatory Visit: Admitting: Internal Medicine

## 2024-08-31 VITALS — BP 148/96 | HR 87 | Temp 98.1°F | Ht 60.08 in | Wt 221.2 lb

## 2024-08-31 DIAGNOSIS — Z720 Tobacco use: Secondary | ICD-10-CM

## 2024-08-31 DIAGNOSIS — Z79899 Other long term (current) drug therapy: Secondary | ICD-10-CM | POA: Diagnosis not present

## 2024-08-31 DIAGNOSIS — I10 Essential (primary) hypertension: Secondary | ICD-10-CM | POA: Diagnosis not present

## 2024-08-31 DIAGNOSIS — Z2821 Immunization not carried out because of patient refusal: Secondary | ICD-10-CM

## 2024-08-31 NOTE — Patient Instructions (Addendum)
 Good to see you  Take whole dose of  medication for bp  goal below 130/80 average  for future heart health.   Plan send in readings  after a month  Make cpe appt for January and will do lab update at that visit.   Wt Readings from Last 3 Encounters:  08/31/24 221 lb 3.2 oz (100.3 kg)  12/29/23 226 lb 9.6 oz (102.8 kg)  11/25/23 226 lb 12.8 oz (102.9 kg)

## 2024-08-31 NOTE — Progress Notes (Signed)
 Chief Complaint  Patient presents with   Medical Management of Chronic Issues    Pt follow up on BP. Pt reports chest pain yesterday, on and off. Denied of radiates. Thought it was from heart burn. No chest pain today    HPI: Shirley Alvarez 42 y.o. come in for Chronic disease management  HT  6 mos check   Taking bp med regular but only 1/2 dose  cause trying to get off  working on lsi  losing some weight . No recnet bp check last over a month ago maybe 128-135 /85 range Still tobacco 4-6 per day?  Active walking at work and using bike once a week.  Still  hopes to get pregnant no other intervention.  ROS: See pertinent positives and negatives per HPI. 1 episode of sharp pain luc no assoc and lasted seconds  .    Past Medical History:  Diagnosis Date   Allergic rhinitis    AMA (advanced maternal age) primigravida 35+    Asthma    Fibroid    History of ectopic pregnancy    X3   last one 07/ 2015  (received MTX injection)   Hydrosalpinx    LEFT   Hypertension    Pelvic adhesions    Wears glasses     Family History  Problem Relation Age of Onset   Liver disease Father    Asthma Sister    Asthma Sister    Anesthesia problems Other     Social History   Socioeconomic History   Marital status: Married    Spouse name: Not on file   Number of children: Not on file   Years of education: Not on file   Highest education level: Master's degree (e.g., MA, MS, MEng, MEd, MSW, MBA)  Occupational History   Not on file  Tobacco Use   Smoking status: Every Day    Current packs/day: 0.25    Average packs/day: 0.3 packs/day for 19.0 years (4.8 ttl pk-yrs)    Types: Cigarettes   Smokeless tobacco: Never  Vaping Use   Vaping status: Never Used  Substance and Sexual Activity   Alcohol use: No    Alcohol/week: 0.0 standard drinks of alcohol   Drug use: No   Sexual activity: Yes    Birth control/protection: None  Other Topics Concern   Not on file  Social History  Narrative   Early Childhood education   Kindergarten age group      History of miscarriage December 2011   Social Drivers of Corporate investment banker Strain: Medium Risk (08/31/2024)   Overall Financial Resource Strain (CARDIA)    Difficulty of Paying Living Expenses: Somewhat hard  Food Insecurity: No Food Insecurity (08/31/2024)   Hunger Vital Sign    Worried About Running Out of Food in the Last Year: Never true    Ran Out of Food in the Last Year: Never true  Transportation Needs: No Transportation Needs (08/31/2024)   PRAPARE - Administrator, Civil Service (Medical): No    Lack of Transportation (Non-Medical): No  Physical Activity: Insufficiently Active (08/31/2024)   Exercise Vital Sign    Days of Exercise per Week: 3 days    Minutes of Exercise per Session: 30 min  Stress: No Stress Concern Present (08/31/2024)   [employer] of Occupational Health - Occupational Stress Questionnaire    Feeling of Stress: Not at all  Social Connections: Socially Integrated (08/31/2024)   Social Connection and Isolation  Panel    Frequency of Communication with Friends and Family: Once a week    Frequency of Social Gatherings with Friends and Family: More than three times a week    Attends Religious Services: More than 4 times per year    Active Member of [employer] or Organizations: Yes    Attends Engineer, structural: More than 4 times per year    Marital Status: Married    Outpatient Medications Prior to Visit  Medication Sig Dispense Refill   acetaminophen  (TYLENOL ) 500 MG tablet Take 1,000 mg by mouth 4 (four) times daily as needed for headache.     albuterol  (VENTOLIN  HFA) 108 (90 Base) MCG/ACT inhaler Inhale 1-2 puffs into the lungs every 6 (six) hours as needed for wheezing or shortness of breath. 1 each 2   IBUPROFEN  PO Take by mouth.     NIFEdipine  (PROCARDIA -XL/NIFEDICAL-XL) 30 MG 24 hr tablet TAKE 1 TABLET(30 MG) BY MOUTH DAILY 90 tablet 1   Prenatal Vit  w/Fe-Methylfol-FA (PNV PO) Take 1 tablet by mouth daily.     meloxicam  (MOBIC ) 15 MG tablet Take 1 tablet (15 mg total) by mouth daily. (Patient not taking: Reported on 12/29/2023) 30 tablet 0   No facility-administered medications prior to visit.     EXAM:  BP (!) 148/100 (BP Location: Right Arm, Patient Position: Sitting, Cuff Size: Large)   Pulse 87   Temp 98.1 F (36.7 C) (Oral)   Ht 5' 0.08 (1.526 m)   Wt 221 lb 3.2 oz (100.3 kg)   LMP 08/22/2024 (Exact Date)   SpO2 98%   BMI 43.09 kg/m   Body mass index is 43.09 kg/m.  GENERAL: vitals reviewed and listed above, alert, oriented, appears well hydrated and in no acute distress HEENT: atraumatic, conjunctiva  clear, no obvious abnormalities on inspection of external nose and ears  NECK: no obvious masses on inspection palpation  LUNGS: clear to auscultation bilaterally, no wheezes, rales or rhonchi, good air movement CV: HRRR, no clubbing cyanosis or  peripheral edema nl cap refill  MS: moves all extremities without noticeable focal  abnormality PSYCH: pleasant and cooperative, no obvious depression or anxiety Lab Results  Component Value Date   WBC 8.4 11/25/2023   HGB 12.3 11/25/2023   HCT 38.9 11/25/2023   PLT 461.0 (H) 11/25/2023   GLUCOSE 89 11/25/2023   CHOL 183 11/25/2023   TRIG 53.0 11/25/2023   HDL 51.20 11/25/2023   LDLCALC 121 (H) 11/25/2023   ALT 18 11/25/2023   AST 17 11/25/2023   NA 137 11/25/2023   K 4.1 11/25/2023   CL 103 11/25/2023   CREATININE 0.79 11/25/2023   BUN 9 11/25/2023   CO2 26 11/25/2023   TSH 2.63 11/25/2023   INR 1.00 07/08/2017   HGBA1C 5.7 11/25/2023   BP Readings from Last 3 Encounters:  08/31/24 (!) 148/100  12/29/23 136/78  11/25/23 (!) 160/118    ASSESSMENT AND PLAN:  Discussed the following assessment and plan:  Essential hypertension  Medication management  Tobacco user  Influenza vaccination declined by patient Agree wit optimizing life style as possible   counsel  Go back to whole dose 30 xl  nifedipine    Send in readings 1 month  Cpe labs at visit January .   Tobacco cessation advised she is aware  Counsel Declined flu vaccine today Dont think the cp os cv cause   fu if recurrent alarm etc  -Patient advised to return or notify health care team  if  new concerns arise.  Patient Instructions  Good to see you  Take whole dose of  medication for bp  goal below 130/80 average  for future heart health.   Plan send in readings  after a month  Make cpe appt for January and will do lab update at that visit.   Wt Readings from Last 3 Encounters:  08/31/24 221 lb 3.2 oz (100.3 kg)  12/29/23 226 lb 9.6 oz (102.8 kg)  11/25/23 226 lb 12.8 oz (102.9 kg)       Shavone Nevers K. Arisha Gervais M.D.

## 2024-12-06 NOTE — Progress Notes (Unsigned)
 "  Chief Complaint  Patient presents with   Annual Exam    Pt is fasting. Pt reports L arm pain/weakness started today. Chest pain yesterday, quick sharp pain then went away. No chest pain today, no sob, no headache, no change in vision.     HPI: Patient  Shirley Alvarez  44 y.o. comes in today for Preventive Health Care visit   BP HT up at home    ave  135/90     Getting  new ob gyune prev left.   Gets  r le pain muscspasm   walking at times and sometimes IC   may had in preg  OSA evaluated but not felt to need   Health Maintenance  Topic Date Due   COVID-19 Vaccine (3 - Pfizer risk series) 12/23/2024 (Originally 03/06/2020)   Influenza Vaccine  02/20/2025 (Originally 06/23/2024)   Cervical Cancer Screening (HPV/Pap Cotest)  03/07/2025 (Originally 04/09/2024)   Mammogram  06/06/2025 (Originally 10/12/2023)   Pneumococcal Vaccine (1 of 2 - PCV) 12/07/2025 (Originally 09/10/2000)   Hepatitis B Vaccines 19-59 Average Risk (1 of 3 - 19+ 3-dose series) 12/07/2025 (Originally 09/10/2000)   DTaP/Tdap/Td (3 - Td or Tdap) 01/09/2029   HPV VACCINES (No Doses Required) Completed   Hepatitis C Screening  Completed   HIV Screening  Completed   Meningococcal B Vaccine  Aged Out   Health Maintenance Review LIFESTYLE:  Exercise:  2 x per per weeks 50 minn Tobacco/ETS:  3-4 per day  Alcohol:  ocass Sugar beverages: once a day .  Sleep:  7-8  Drug use: no HH of   3  no pets  Work:  40 hours day      ROS:  GEN/ HEENT: No fever, significant weight changes sweats headaches vision problems hearing changes, CV/ PULM; No chest pain shortness of breath cough, syncope,edema  change in exercise tolerance. GI /GU: No adominal pain, vomiting, change in bowel habits. No blood in the stool. No significant GU symptoms. SKIN/HEME: ,no acute skin rashes suspicious lesions or bleeding. No lymphadenopathy, nodules, masses.  NEURO/ PSYCH:  No neurologic signs such as weakness numbness. No depression  anxiety. IMM/ Allergy: No unusual infections.  Allergy .   REST of 12 system review negative except as per HPI   Past Medical History:  Diagnosis Date   Allergic rhinitis    AMA (advanced maternal age) primigravida 35+    Asthma    Fibroid    History of ectopic pregnancy    X3   last one 07/ 2015  (received MTX injection)   Hydrosalpinx    LEFT   Hypertension    Pelvic adhesions    Wears glasses     Past Surgical History:  Procedure Laterality Date   CESAREAN SECTION N/A 02/14/2019   Procedure: CESAREAN SECTION;  Surgeon: Timmie Norris, MD;  Location: [facility];  Service: Obstetrics;  Laterality: N/A;   CHROMOPERTUBATION Bilateral 11/07/2014   Procedure: CHROMOPERTUBATION;  Surgeon: Cynthia Loss, MD;  Location: [facility];  Service: Gynecology;  Laterality: Bilateral;   LAPAROSCOPIC LYSIS OF ADHESIONS N/A 11/07/2014   Procedure: LAPAROSCOPIC LYSIS OF ADHESIONS AND FIMBRIOPLASTY;  Surgeon: Cynthia Loss, MD;  Location: [facility] [place];  Service: Gynecology;  Laterality: N/A;   WISDOM TOOTH EXTRACTION      Family History  Problem Relation Age of Onset   Liver disease Father    Asthma Sister    Asthma Sister    Anesthesia problems Other  Social History   Socioeconomic History   Marital status: Married    Spouse name: Not on file   Number of children: Not on file   Years of education: Not on file   Highest education level: Master's degree (e.g., MA, MS, MEng, MEd, MSW, MBA)  Occupational History   Not on file  Tobacco Use   Smoking status: Every Day    Current packs/day: 0.25    Average packs/day: 0.3 packs/day for 19.0 years (4.8 ttl pk-yrs)    Types: Cigarettes   Smokeless tobacco: Never  Vaping Use   Vaping status: Never Used  Substance and Sexual Activity   Alcohol use: No    Alcohol/week: 0.0 standard drinks of alcohol   Drug use: No   Sexual activity: Yes    Birth control/protection: None  Other Topics Concern    Not on file  Social History Narrative   Early Childhood education   Kindergarten age group      History of miscarriage December 2011   Social Drivers of Health   Tobacco Use: High Risk (12/07/2024)   Patient History    Smoking Tobacco Use: Every Day    Smokeless Tobacco Use: Never    Passive Exposure: Not on file  Financial Resource Strain: Low Risk (12/07/2024)   Overall Financial Resource Strain (CARDIA)    Difficulty of Paying Living Expenses: Not very hard  Food Insecurity: No Food Insecurity (12/07/2024)   Epic    Worried About Programme Researcher, Broadcasting/film/video in the Last Year: Never true    Ran Out of Food in the Last Year: Never true  Transportation Needs: No Transportation Needs (12/07/2024)   Epic    Lack of Transportation (Medical): No    Lack of Transportation (Non-Medical): No  Physical Activity: Insufficiently Active (12/07/2024)   Exercise Vital Sign    Days of Exercise per Week: 2 days    Minutes of Exercise per Session: 30 min  Stress: No Stress Concern Present (12/07/2024)   [employer] of Occupational Health - Occupational Stress Questionnaire    Feeling of Stress: Not at all  Social Connections: Moderately Integrated (12/07/2024)   Social Connection and Isolation Panel    Frequency of Communication with Friends and Family: Once a week    Frequency of Social Gatherings with Friends and Family: Once a week    Attends Religious Services: More than 4 times per year    Active Member of Clubs or Organizations: Yes    Attends Banker Meetings: 1 to 4 times per year    Marital Status: Married  Depression (PHQ2-9): Low Risk (11/25/2023)   Depression (PHQ2-9)    PHQ-2 Score: 0  Alcohol Screen: Low Risk (12/07/2024)   Alcohol Screen    Last Alcohol Screening Score (AUDIT): 1  Housing: High Risk (12/07/2024)   Epic    Unable to Pay for Housing in the Last Year: Yes    Number of Times Moved in the Last Year: 0    Homeless in the Last Year: No  Utilities: Not on  file  Health Literacy: Not on file    Outpatient Medications Prior to Visit  Medication Sig Dispense Refill   acetaminophen  (TYLENOL ) 500 MG tablet Take 1,000 mg by mouth 4 (four) times daily as needed for headache.     albuterol  (VENTOLIN  HFA) 108 (90 Base) MCG/ACT inhaler Inhale 1-2 puffs into the lungs every 6 (six) hours as needed for wheezing or shortness of breath. 1 each 2   IBUPROFEN   PO Take by mouth. (Patient taking differently: Take by mouth as needed.)     Prenatal Vit w/Fe-Methylfol-FA (PNV PO) Take 1 tablet by mouth daily.     NIFEdipine  (PROCARDIA -XL/NIFEDICAL-XL) 30 MG 24 hr tablet TAKE 1 TABLET(30 MG) BY MOUTH DAILY 90 tablet 1   No facility-administered medications prior to visit.     EXAM:  BP (!) 136/94 (BP Location: Left Arm, Patient Position: Sitting, Cuff Size: Large)   Pulse 79   Temp 97.6 F (36.4 C) (Oral)   Ht 5' 0.08 (1.526 m)   Wt 220 lb 9.6 oz (100.1 kg)   LMP 11/20/2024   SpO2 99%   BMI 42.97 kg/m   Body mass index is 42.97 kg/m. Wt Readings from Last 3 Encounters:  12/07/24 220 lb 9.6 oz (100.1 kg)  08/31/24 221 lb 3.2 oz (100.3 kg)  12/29/23 226 lb 9.6 oz (102.8 kg)    Physical Exam: Vital signs reviewed HZW:Uypd is a well-developed well-nourished alert cooperative    who appearsr stated age in no acute distress.  HEENT: normocephalic atraumatic , Eyes: PERRL EOM's full, conjunctiva clear, Nares: paten,t no deformity discharge or tenderness., Ears: no deformity EAC's clear TMs with normal landmarks. Mouth: clear OP, no lesions, edema.  Moist mucous membranes. Dentition in adequate repair. NECK: supple without masses, thyromegaly or bruits. CHEST/PULM:  Clear to auscultation and percussion breath sounds equal no wheeze , rales or rhonchi. No chest wall deformities or tenderness. Breast: normal by inspection . No dimpling, discharge, masses, tenderness or discharge . CV: PMI is nondisplaced, S1 S2 no gallops, murmurs, rubs. Peripheral pulses  are full without delay.No JVD .  ABDOMEN: Bowel sounds normal nontender  No guard or rebound, no hepato splenomegal no CVA tenderness.   Extremtities:  No clubbing cyanosis or edema, no acute joint swelling or redness no focal atrophy NEURO:  Oriented x3, cranial nerves 3-12 appear to be intact, no obvious focal weakness,gait within normal limits no abnormal reflexes or asymmetrical SKIN: No acute rashes normal turgor, color, no bruising or petechiae. PSYCH: Oriented, good eye contact, no obvious depression anxiety, cognition and judgment appear normal. LN: no cervical axillary inguinal adenopathy  Lab Results  Component Value Date   WBC 8.4 11/25/2023   HGB 12.3 11/25/2023   HCT 38.9 11/25/2023   PLT 461.0 (H) 11/25/2023   GLUCOSE 89 11/25/2023   CHOL 183 11/25/2023   TRIG 53.0 11/25/2023   HDL 51.20 11/25/2023   LDLCALC 121 (H) 11/25/2023   ALT 18 11/25/2023   AST 17 11/25/2023   NA 137 11/25/2023   K 4.1 11/25/2023   CL 103 11/25/2023   CREATININE 0.79 11/25/2023   BUN 9 11/25/2023   CO2 26 11/25/2023   TSH 2.63 11/25/2023   INR 1.00 07/08/2017   HGBA1C 5.7 11/25/2023    BP Readings from Last 3 Encounters:  12/07/24 (!) 136/94  08/31/24 (!) 148/96  12/29/23 136/78    Labplan eviewed with patient   EKG NSR biphasic p in v2 other  otherwise nl  reading as poss  LAE  nl intervals   rate 62   ASSESSMENT AND PLAN:  Discussed the following assessment and plan:    ICD-10-CM   1. Visit for preventive health examination  Z00.00 Vitamin B12    Basic metabolic panel with GFR    CBC with Differential/Platelet    Hemoglobin A1c    Hepatic function panel    Lipid panel    TSH    Microalbumin / creatinine  urine ratio    Magnesium    Lipoprotein A (LPA)    T4, Free    2. Tobacco user  Z72.0 T4, Free    3. Essential hypertension  I10 EKG 12-Lead    Vitamin B12    Basic metabolic panel with GFR    CBC with Differential/Platelet    Hemoglobin A1c    Hepatic function  panel    Lipid panel    TSH    Microalbumin / creatinine urine ratio    Magnesium    Lipoprotein A (LPA)    T4, Free    4. Medication management  Z79.899 Vitamin B12    Basic metabolic panel with GFR    CBC with Differential/Platelet    Hemoglobin A1c    Hepatic function panel    Lipid panel    TSH    Microalbumin / creatinine urine ratio    Magnesium    Lipoprotein A (LPA)    T4, Free    5. Left upper arm pain  M79.622 EKG 12-Lead    Vitamin B12    Basic metabolic panel with GFR    CBC with Differential/Platelet    Hemoglobin A1c    Hepatic function panel    Lipid panel    TSH    Microalbumin / creatinine urine ratio    Magnesium    T4, Free    6. Leg cramp  R25.2 Vitamin B12    Basic metabolic panel with GFR    CBC with Differential/Platelet    Hemoglobin A1c    Hepatic function panel    Lipid panel    TSH    Microalbumin / creatinine urine ratio    Magnesium    T4, Free    7. Elevated LDL cholesterol level  E78.00 Vitamin B12    Basic metabolic panel with GFR    CBC with Differential/Platelet    Hemoglobin A1c    Hepatic function panel    Lipid panel    TSH    Microalbumin / creatinine urine ratio    Magnesium    Lipoprotein A (LPA)    T4, Free     Update labs fasting Uncertain cause of the  activity iduce r left  cramping   ? If pulse = le  Encouraged tobacco free  HT control and  healthy weight loss .  Is still  hoping for pregnancy  to get  new ob gyne We are using nifedipine  since tolerated in last preg a opposed to other class of meds   had issues with methyldopa  .  Consider cards help if needed cause of age and preconeption.  Uncertain if   thyroid  enlarged? R more than left but no nodules   will follow and await lab tests.   Return in about 3 months (around 03/07/2025).  Patient Care Team: Charlett Apolinar POUR, MD as PCP - General Storm Setter, DO as Consulting Physician (Obstetrics and Gynecology) Patient Instructions  Lab today  Ok to  stay on nifedipine   but bp not controlled  So  can try increasing dose to 60 xl once a day.  Plan fu in 2-3 mos or earlier depending EKG ok today.  After lab consider  getting  circulation check on legs ( artery abis) because of leg cramps.   But  healthy weight loss  and stopping tobacco is advised  to help heart  and circulation.   Daysen Gundrum K. Deardra Hinkley M.D.  "

## 2024-12-07 ENCOUNTER — Ambulatory Visit (INDEPENDENT_AMBULATORY_CARE_PROVIDER_SITE_OTHER): Admitting: Internal Medicine

## 2024-12-07 VITALS — BP 136/94 | HR 79 | Temp 97.6°F | Ht 60.08 in | Wt 220.6 lb

## 2024-12-07 DIAGNOSIS — M79622 Pain in left upper arm: Secondary | ICD-10-CM

## 2024-12-07 DIAGNOSIS — I10 Essential (primary) hypertension: Secondary | ICD-10-CM | POA: Diagnosis not present

## 2024-12-07 DIAGNOSIS — R252 Cramp and spasm: Secondary | ICD-10-CM

## 2024-12-07 DIAGNOSIS — E7800 Pure hypercholesterolemia, unspecified: Secondary | ICD-10-CM

## 2024-12-07 DIAGNOSIS — Z79899 Other long term (current) drug therapy: Secondary | ICD-10-CM | POA: Diagnosis not present

## 2024-12-07 DIAGNOSIS — Z720 Tobacco use: Secondary | ICD-10-CM

## 2024-12-07 DIAGNOSIS — Z0000 Encounter for general adult medical examination without abnormal findings: Secondary | ICD-10-CM | POA: Diagnosis not present

## 2024-12-07 LAB — LIPID PANEL
Cholesterol: 189 mg/dL (ref 28–200)
HDL: 53.1 mg/dL
LDL Cholesterol: 124 mg/dL — ABNORMAL HIGH (ref 10–99)
NonHDL: 135.65
Total CHOL/HDL Ratio: 4
Triglycerides: 57 mg/dL (ref 10.0–149.0)
VLDL: 11.4 mg/dL (ref 0.0–40.0)

## 2024-12-07 LAB — BASIC METABOLIC PANEL WITH GFR
BUN: 9 mg/dL (ref 6–23)
CO2: 26 meq/L (ref 19–32)
Calcium: 10.6 mg/dL — ABNORMAL HIGH (ref 8.4–10.5)
Chloride: 103 meq/L (ref 96–112)
Creatinine, Ser: 0.85 mg/dL (ref 0.40–1.20)
GFR: 84.02 mL/min
Glucose, Bld: 77 mg/dL (ref 70–99)
Potassium: 3.9 meq/L (ref 3.5–5.1)
Sodium: 136 meq/L (ref 135–145)

## 2024-12-07 LAB — CBC WITH DIFFERENTIAL/PLATELET
Basophils Absolute: 0 K/uL (ref 0.0–0.1)
Basophils Relative: 0.3 % (ref 0.0–3.0)
Eosinophils Absolute: 0.4 K/uL (ref 0.0–0.7)
Eosinophils Relative: 4.4 % (ref 0.0–5.0)
HCT: 40.8 % (ref 36.0–46.0)
Hemoglobin: 13.5 g/dL (ref 12.0–15.0)
Lymphocytes Relative: 33.7 % (ref 12.0–46.0)
Lymphs Abs: 3 K/uL (ref 0.7–4.0)
MCHC: 33.1 g/dL (ref 30.0–36.0)
MCV: 81.6 fl (ref 78.0–100.0)
Monocytes Absolute: 0.6 K/uL (ref 0.1–1.0)
Monocytes Relative: 7.1 % (ref 3.0–12.0)
Neutro Abs: 4.9 K/uL (ref 1.4–7.7)
Neutrophils Relative %: 54.5 % (ref 43.0–77.0)
Platelets: 422 K/uL — ABNORMAL HIGH (ref 150.0–400.0)
RBC: 5 Mil/uL (ref 3.87–5.11)
RDW: 16.5 % — ABNORMAL HIGH (ref 11.5–15.5)
WBC: 9 K/uL (ref 4.0–10.5)

## 2024-12-07 LAB — HEPATIC FUNCTION PANEL
ALT: 22 U/L (ref 3–35)
AST: 20 U/L (ref 5–37)
Albumin: 4.7 g/dL (ref 3.5–5.2)
Alkaline Phosphatase: 85 U/L (ref 39–117)
Bilirubin, Direct: 0 mg/dL — ABNORMAL LOW (ref 0.1–0.3)
Total Bilirubin: 0.4 mg/dL (ref 0.2–1.2)
Total Protein: 8 g/dL (ref 6.0–8.3)

## 2024-12-07 LAB — MICROALBUMIN / CREATININE URINE RATIO
Creatinine,U: 219.1 mg/dL
Microalb Creat Ratio: 7.2 mg/g (ref 0.0–30.0)
Microalb, Ur: 1.6 mg/dL (ref 0.7–1.9)

## 2024-12-07 LAB — VITAMIN B12: Vitamin B-12: 516 pg/mL (ref 211–911)

## 2024-12-07 LAB — MAGNESIUM: Magnesium: 2 mg/dL (ref 1.5–2.5)

## 2024-12-07 LAB — TSH: TSH: 1.61 u[IU]/mL (ref 0.35–5.50)

## 2024-12-07 LAB — HEMOGLOBIN A1C: Hgb A1c MFr Bld: 5.4 % (ref 4.6–6.5)

## 2024-12-07 MED ORDER — NIFEDIPINE ER OSMOTIC RELEASE 30 MG PO TB24: ORAL_TABLET | ORAL | 1 refills | Status: DC

## 2024-12-07 MED ORDER — NIFEDIPINE ER OSMOTIC RELEASE 60 MG PO TB24: 60.0000 mg | ORAL_TABLET | Freq: Every day | ORAL | 3 refills | Status: AC

## 2024-12-07 NOTE — Patient Instructions (Addendum)
 Lab today  Ok to stay on nifedipine   but bp not controlled  So  can try increasing dose to 60 xl once a day.  Plan fu in 2-3 mos or earlier depending EKG ok today.  After lab consider  getting  circulation check on legs ( artery abis) because of leg cramps.   But  healthy weight loss  and stopping tobacco is advised  to help heart  and circulation.

## 2024-12-08 ENCOUNTER — Ambulatory Visit: Payer: Self-pay | Admitting: Internal Medicine

## 2024-12-08 NOTE — Progress Notes (Signed)
 Lab shows same cholesterol  Calcium level  is borderline up may be  a lab draw effect  but we can do a follow up  lab  to be sure  Kidney function normal   Liver thyroid  b12 normal range   No diabetes .  Rest of out of range stable of clinically insignificant .   Please order intact pth with calcium, and vit d level  dx elevated calcium   make sure hydrated and avoid prolonged tourniquet when drawing blood

## 2024-12-15 LAB — LIPOPROTEIN A (LPA): Lipoprotein (a): 186 nmol/L — ABNORMAL HIGH

## 2024-12-15 NOTE — Progress Notes (Signed)
 Lipo a  level is high which is hereditary risk to develop vascular disease in future.( Should not effect fertility)   Keep fu appt  and lab.  If not controlled Bp we may consider getting the cardiology prevention team opinion.

## 2024-12-22 ENCOUNTER — Other Ambulatory Visit (INDEPENDENT_AMBULATORY_CARE_PROVIDER_SITE_OTHER)

## 2024-12-22 LAB — VITAMIN D 25 HYDROXY (VIT D DEFICIENCY, FRACTURES): VITD: <7 ng/mL — ABNORMAL LOW (ref 30.00–100.00)

## 2024-12-23 LAB — PTH, INTACT AND CALCIUM
Calcium: 10.3 mg/dL — ABNORMAL HIGH (ref 8.6–10.2)
PTH: 65 pg/mL (ref 16–77)

## 2024-12-28 ENCOUNTER — Ambulatory Visit: Payer: Self-pay | Admitting: Internal Medicine

## 2024-12-28 DIAGNOSIS — E7841 Elevated Lipoprotein(a): Secondary | ICD-10-CM

## 2024-12-28 DIAGNOSIS — E559 Vitamin D deficiency, unspecified: Secondary | ICD-10-CM

## 2024-12-28 DIAGNOSIS — I10 Essential (primary) hypertension: Secondary | ICD-10-CM

## 2024-12-28 MED ORDER — VITAMIN D (ERGOCALCIFEROL) 1.25 MG (50000 UNIT) PO CAPS: 50000.0000 [IU] | ORAL_CAPSULE | ORAL | 0 refills | Status: AC

## 2024-12-28 NOTE — Progress Notes (Signed)
 Vit d is extremely low   please add  vit d 50000 international units 1 per week for 12 weeks   Also  calcium level borderline up  I think we may do best by having you see a cardiology since you are trying for pregnancy and   you have cv risk factors  and may need help with BP control .   If agree ...  K please place  order for cardiolgy referral , ht , high lipo a , preconception risk reduction.  But want fu appt  after seeing cardiology  to make sure  fu vit d etc

## 2025-01-09 ENCOUNTER — Ambulatory Visit: Admitting: Cardiology

## 2025-03-07 ENCOUNTER — Ambulatory Visit: Admitting: Internal Medicine
